# Patient Record
Sex: Female | Born: 1935 | Race: White | Hispanic: No | Smoking: Former smoker
Health system: Southern US, Community
[De-identification: ages and names within clinical notes are randomized; demographics above are authoritative.]

## PROBLEM LIST (undated history)

## (undated) DIAGNOSIS — H3530 Unspecified macular degeneration: Secondary | ICD-10-CM

## (undated) DIAGNOSIS — R0602 Shortness of breath: Secondary | ICD-10-CM

## (undated) DIAGNOSIS — E039 Hypothyroidism, unspecified: Secondary | ICD-10-CM

## (undated) DIAGNOSIS — I10 Essential (primary) hypertension: Secondary | ICD-10-CM

## (undated) DIAGNOSIS — H269 Unspecified cataract: Secondary | ICD-10-CM

## (undated) DIAGNOSIS — I639 Cerebral infarction, unspecified: Secondary | ICD-10-CM

## (undated) DIAGNOSIS — I499 Cardiac arrhythmia, unspecified: Secondary | ICD-10-CM

## (undated) DIAGNOSIS — M810 Age-related osteoporosis without current pathological fracture: Secondary | ICD-10-CM

## (undated) DIAGNOSIS — E785 Hyperlipidemia, unspecified: Secondary | ICD-10-CM

## (undated) DIAGNOSIS — E119 Type 2 diabetes mellitus without complications: Secondary | ICD-10-CM

## (undated) DIAGNOSIS — J449 Chronic obstructive pulmonary disease, unspecified: Secondary | ICD-10-CM

## (undated) HISTORY — PX: TONSILLECTOMY: SUR1361

---

## 1998-05-20 ENCOUNTER — Emergency Department (HOSPITAL_COMMUNITY): Admission: EM | Admit: 1998-05-20 | Discharge: 1998-05-20 | Payer: Self-pay | Admitting: Emergency Medicine

## 1999-06-11 ENCOUNTER — Emergency Department (HOSPITAL_COMMUNITY): Admission: EM | Admit: 1999-06-11 | Discharge: 1999-06-11 | Payer: Self-pay

## 2000-10-17 ENCOUNTER — Ambulatory Visit (HOSPITAL_COMMUNITY): Admission: RE | Admit: 2000-10-17 | Discharge: 2000-10-17 | Payer: Self-pay | Admitting: Ophthalmology

## 2000-11-15 ENCOUNTER — Other Ambulatory Visit: Admission: RE | Admit: 2000-11-15 | Discharge: 2000-11-15 | Payer: Self-pay | Admitting: Internal Medicine

## 2001-02-21 ENCOUNTER — Encounter: Payer: Self-pay | Admitting: Ophthalmology

## 2001-02-23 ENCOUNTER — Ambulatory Visit (HOSPITAL_COMMUNITY): Admission: RE | Admit: 2001-02-23 | Discharge: 2001-02-23 | Payer: Self-pay | Admitting: Ophthalmology

## 2001-05-11 ENCOUNTER — Encounter: Payer: Self-pay | Admitting: Otolaryngology

## 2001-05-11 ENCOUNTER — Encounter: Admission: RE | Admit: 2001-05-11 | Discharge: 2001-05-11 | Payer: Self-pay | Admitting: Otolaryngology

## 2003-07-13 ENCOUNTER — Emergency Department (HOSPITAL_COMMUNITY): Admission: EM | Admit: 2003-07-13 | Discharge: 2003-07-13 | Payer: Self-pay | Admitting: Emergency Medicine

## 2003-08-11 ENCOUNTER — Encounter: Admission: RE | Admit: 2003-08-11 | Discharge: 2003-08-11 | Payer: Self-pay | Admitting: Internal Medicine

## 2003-09-25 ENCOUNTER — Ambulatory Visit (HOSPITAL_COMMUNITY): Admission: RE | Admit: 2003-09-25 | Discharge: 2003-09-25 | Payer: Self-pay | Admitting: Orthopaedic Surgery

## 2004-07-26 ENCOUNTER — Encounter: Admission: RE | Admit: 2004-07-26 | Discharge: 2004-07-26 | Payer: Self-pay | Admitting: Internal Medicine

## 2005-05-06 ENCOUNTER — Ambulatory Visit (HOSPITAL_COMMUNITY): Admission: RE | Admit: 2005-05-06 | Discharge: 2005-05-06 | Payer: Self-pay | Admitting: Internal Medicine

## 2007-06-19 ENCOUNTER — Encounter: Admission: RE | Admit: 2007-06-19 | Discharge: 2007-06-19 | Payer: Self-pay | Admitting: Internal Medicine

## 2007-10-11 ENCOUNTER — Inpatient Hospital Stay (HOSPITAL_COMMUNITY): Admission: EM | Admit: 2007-10-11 | Discharge: 2007-10-15 | Payer: Self-pay | Admitting: Emergency Medicine

## 2007-10-11 ENCOUNTER — Ambulatory Visit: Payer: Self-pay | Admitting: Cardiology

## 2007-10-12 ENCOUNTER — Ambulatory Visit: Payer: Self-pay | Admitting: Surgery

## 2007-10-12 ENCOUNTER — Encounter (INDEPENDENT_AMBULATORY_CARE_PROVIDER_SITE_OTHER): Payer: Self-pay | Admitting: Internal Medicine

## 2008-04-30 ENCOUNTER — Encounter: Admission: RE | Admit: 2008-04-30 | Discharge: 2008-04-30 | Payer: Self-pay | Admitting: Internal Medicine

## 2010-11-23 ENCOUNTER — Ambulatory Visit (HOSPITAL_COMMUNITY)
Admission: RE | Admit: 2010-11-23 | Discharge: 2010-11-23 | Disposition: A | Discharge status: Home / Self Care | Payer: Medicare Other | Source: Ambulatory Visit | Attending: Endocrinology | Admitting: Endocrinology

## 2010-11-23 ENCOUNTER — Other Ambulatory Visit (HOSPITAL_COMMUNITY): Payer: Self-pay | Admitting: Endocrinology

## 2010-11-23 DIAGNOSIS — M79609 Pain in unspecified limb: Secondary | ICD-10-CM | POA: Insufficient documentation

## 2010-11-23 DIAGNOSIS — M169 Osteoarthritis of hip, unspecified: Secondary | ICD-10-CM | POA: Insufficient documentation

## 2010-11-23 DIAGNOSIS — R52 Pain, unspecified: Secondary | ICD-10-CM

## 2010-11-23 DIAGNOSIS — M1610 Unilateral primary osteoarthritis, unspecified hip: Secondary | ICD-10-CM | POA: Insufficient documentation

## 2010-12-02 ENCOUNTER — Ambulatory Visit (HOSPITAL_COMMUNITY)
Admission: RE | Admit: 2010-12-02 | Discharge: 2010-12-02 | Disposition: A | Discharge status: Home / Self Care | Payer: Medicare Other | Source: Ambulatory Visit | Attending: Orthopedic Surgery | Admitting: Orthopedic Surgery

## 2010-12-02 DIAGNOSIS — I70209 Unspecified atherosclerosis of native arteries of extremities, unspecified extremity: Secondary | ICD-10-CM | POA: Insufficient documentation

## 2010-12-02 DIAGNOSIS — M79609 Pain in unspecified limb: Secondary | ICD-10-CM

## 2010-12-02 DIAGNOSIS — M549 Dorsalgia, unspecified: Secondary | ICD-10-CM | POA: Insufficient documentation

## 2010-12-02 DIAGNOSIS — I10 Essential (primary) hypertension: Secondary | ICD-10-CM | POA: Insufficient documentation

## 2011-02-08 NOTE — H&P (Signed)
NAME:  Alison Singleton, Alison Singleton                 ACCOUNT NO.:  [id number]   MEDICAL RECORD NO.:  [id number]          PATIENT TYPE:  EMS   LOCATION:  MAJO                         FACILITY:  [facility]   PHYSICIAN:  Mark A. Perini, M.D.   DATE OF BIRTH:  1916-10-22   DATE OF ADMISSION:  10/11/2007  DATE OF DISCHARGE:                              HISTORY & PHYSICAL   CHIEF COMPLAINT:  Trouble speaking and weakness.   HISTORY OF PRESENT ILLNESS:  Alison Singleton is a pleasant 77 year old female who  was outside wrapping up her garden hose due to some expected cold  weather this evening.  She got down low towards the ground and had a  very difficult time getting back up, which is unusual for her.  Then she  noticed about the same time at about 2:30 this afternoon that she had  trouble seeing in her right field of vision, and she was having some  trouble with word-finding.  She eventually called her daughter to say  that she was having some trouble.  Her daughter came to the house around  5:00 or 5:30 and called our office, and she was brought to the emergency  room by ambulance.  She will require admission for possible left brain  stroke.   PAST MEDICAL HISTORY:  1. Type 2 diabetes.  2. Hypertension.  3. History of paroxysmal supraventricular tachycardia.  4. Hyperlipidemia.  5. Osteoporosis.  6. Chronic obstructive pulmonary disease.  7. History of atrial fibrillation.  8. Allergic rhinitis.  9. Bilateral cataracts.  10.Left ventricular hypertrophy.  11.Cervical spur.  12.Past history of shingles.   ALLERGIES:  No known allergies.   MEDICATIONS:  Synthroid 0.1 mg daily.  She says she does not take  Fosamax or Allegra even though these are on her medication sheet from  the office.  She is on Micardis of an unknown dose.  She says she  stopped TriCor and stopped vitamin D.   SOCIAL HISTORY:  She is widowed.  She has a son who has had brain damage  and is still in a nursing facility since he was injured at  age 70.  Daughter named Victorino Dike, who has health care power-of-attorney.  No  tobacco, no alcohol use.   FAMILY HISTORY:  Father died of an intracranial hemorrhage.  Mother died  of cancer.   REVIEW OF SYSTEMS:  No chest pain.  No shortness of breath.  Normal  bowel movements.  Normal urine output and function.  She states she has  a normal appetite.  She denies any focal weakness or swallowing problems  or diplopia.   PHYSICAL EXAMINATION:  VITAL SIGNS:  Temperature 97.8, blood pressure  178/70, pulse 52, respiratory rate 18, and 98% saturation on room air.  NEUROLOGIC:  She is in no acute distress.  She is alert.  She has  dysphasia and has a lot of trouble with word finding, but she can  converse fairly fluently and appropriately.  Cranial nerves II-XII are  normal.  There is no diplopia.  She states that she does still have some  haziness in the  vision out of the right field of vision.  She has  trouble with right upper extremity finger-nose-finger testing and is a  bit apraxic with her right hand.  Otherwise, there is no focal weakness  noted.  LUNGS:  Clear to auscultation bilaterally with no wheezes, rales or  rhonchi.  HEART:  Regular in rate and rhythm with a 1 to 2 over 6 murmur heard at  the left sternal border in systole.  ABDOMEN:  Soft, nontender, nondistended.  There is no mass or  hepatosplenomegaly.  EXTREMITIES:  There is no peripheral edema.  She moves extremities x 4.   LABORATORY DATA:  White count 9.4 with a normal differential.  Hemoglobin 12.7, platelet count 204,000.  INR 1.0.  Cranial CT scan  personally reviewed shows no acute pathology but there are some old  right basal ganglia lacunes noted.  Sodium 140, potassium 4.7, chloride  111, CO2 24, BUN 18, creatinine 0.9, glucose 108.   ASSESSMENT/PLAN:  An 77 year old female with a probable left brain  infarct.  We will admit to a telemetry bed.  We will check an EKG.  We  will add aspirin  325 mg daily.   We will check an MRI/MRA of the brain  as well as carotid Dopplers and a 2-D echo, and we will also obtain a  neurology consultation.      Mark A. Perini, M.D.  Electronically Signed     MAP/MEDQ  D:  10/11/2007  T:  10/11/2007  Job:  [id number]   cc:   Gwen Pounds, MD

## 2011-02-08 NOTE — Consult Note (Signed)
NAME:  Alison Singleton, Alison Singleton                 ACCOUNT NO.:  [id number]   MEDICAL RECORD NO.:  [id number]          PATIENT TYPE:  INP   LOCATION:  4730                         FACILITY:  [facility]   PHYSICIAN:  Bevelyn Buckles. Champey, M.D.DATE OF BIRTH:  Nov 10, 1916   DATE OF CONSULTATION:  10/12/2007  DATE OF DISCHARGE:                                 CONSULTATION   REQUESTING PHYSICIAN:  Dr. Timothy Lasso.   REASON FOR CONSULT:  Stroke.   HISTORY OF PRESENT ILLNESS:  Alison Singleton is an 77 year old Caucasian  female with multiple medical problems who presented to the ED after  decreased vision on the right, slurred speech, word-finding difficulty,  and right-sided weakness.  The patient's symptoms gradually improved and  resolved.  She still has some dysarthria which the patient states is  normal.  She denies any current vision changes, speech changes,  numbness, tingling, dizziness, vertigo, headaches, falls or loss of  consciousness.  The chart is reviewed.   PAST MEDICAL HISTORY:  Is positive for.  1. Diabetes.  2. Hypertension.  3. PSVT.  4. Hyperlipidemia.  5. Atrial fibrillation in past.  6. Osteoporosis.  7. COPD.   CURRENT MEDICATIONS:  1. Synthroid.  2. Avapro.  3. Colace.  4. Aspirin (started on hospitalization).   ALLERGIES:  None.   SOCIAL HISTORY:  The patient lives alone.  Denies any smoking, alcohol,  or drug use.   FAMILY HISTORY:  Positive for ICH and chain cancer.   REVIEW OF SYSTEMS:  Positive as per HPI.  Also for confusion.  Review of  systems negative as per history of present illness in greater than seven  other organ systems.   EXAMINATION:  VITALS:  Temperature is 98.6, blood pressure 158/62, pulse  60, respirations 22, O2 saturation 99%, CBG 99.  HEENT:  Normocephalic, atraumatic.  Visual fields are intact to finger  counting.  NECK:  Supple.  No carotid bruits.  HEART:  Is regular.  LUNGS:  Clear.  ABDOMEN:  Soft.  EXTREMITIES:  Show good pulses.  NEUROLOGIC  EXAMINATION:  The patient is awake, drowsy.  She is oriented  to name, place, not to year.  She has slightly dysarthric speech at  times.  Mini-mental status is 18/29.  The patient is oriented 5/10, gets  0/3 recall, gets 3/5 spelling world backwards, follows two- to three-  step commands.  Cranial nerves:  Extraocular muscles are intact.  Face  symmetric.  Visual fields are intact.  Motor examination.  The patient  has 4+/5 strength throughout, normal tone.  The patient does have slight  right extremity drift noted.  Sensory examination is within normal  limits to light touch.  Reflexes 1+ throughout.  Cerebellar function is  within normal limits finger-to-nose.  Gait was not assessed secondary to  safety.  MRI/MRA of the brain which was reviewed showed no acute infarct  with motion-degraded MRA with moderate atherosclerosis.   LABORATORY DATA:  Cholesterol 179, LDL 120. WBCs 9.1, hemoglobin 11.7,  hematocrit 34.3, platelets 176.  Sodium is 142, potassium 3.5, chloride  110, CO2 25, BUN 12, creatinine 0.81, glucose  87, calcium 8.8, AST 17,  ALT 12.   IMPRESSION:  This is an 77 year old with decreased right visual field  cut, right-sided weakness and dysarthria, most of which has resolved.  Patient still has slight dysarthria and some slight right upper  extremity drift.  I have reviewed the MRI scan which showed no acute  stroke.  Differential diagnosis of transient ischemic attack versus a  small infarct.  MRI might miss small ischemic infarcts.  I agree with  starting the patient on an aspirin per day.  The patient does have an  elevated LDL level.  Will start on Lipitor 10 mg per day.  I agree with  checking 2-D echo and carotid Dopplers and will check a fasting  homocystine level and get PT, OT and speech consults.  The patient also  might have an underlying dementia versus delirium.  We will check TSH,  B12, RPR, ESR, and folate.  Will follow the patient while she is in the   hospital on the stroke consult service.      Bevelyn Buckles. Nash Shearer, M.D.  Electronically Signed     DRC/MEDQ  D:  10/12/2007  T:  10/12/2007  Job:  [id number]

## 2011-02-08 NOTE — Discharge Summary (Signed)
NAME:  Alison Singleton, Alison Singleton NO.:  [id number]   MEDICAL RECORD NO.:  [id number]          PATIENT TYPE:  INP   LOCATION:  4730                         FACILITY:  [facility]   PHYSICIAN:  Gwen Pounds, MD       DATE OF BIRTH:  1917-01-19   DATE OF ADMISSION:  10/11/2007  DATE OF DISCHARGE:  10/15/2007                               DISCHARGE SUMMARY   PRIMARY CARE PHYSICIAN:  Creola Corn, M.D.   NEUROLOGIST:  Deneen Harts, M.D.   DISCHARGE DIAGNOSES:  1. 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  2. A 40-60% left common carotid artery stenosis without internal      carotid artery stenosis.  3. Resolved stroke.  4. Hypertension.  5. Hyperlipidemia.  6. Diet-controlled diabetes with A1c 5.9.  7. Recurrent SVT with history of paroxysmal atrial fibrillation,      currently off Coumadin secondary to history of bleeding.  8. Right-sided weakness and dysarthria, improved.  9. Osteoporosis.  10.History of chronic obstructive pulmonary disease.  11.History of allergic rhinitis.  12.History of bilateral cataracts.  13.Left ventricular hypertrophy.  14.Cervical spurring.  15.Past history of shingles.   DISCHARGE MEDICATIONS:  1. Fosamax 70 weekly.  2. Simvastatin 40 mg daily.  3. Micardis 40 mg p.o. daily.  4. Digitek 0.125 mg 1/2 p.o. q.a.m.  5. Synthroid 0.1 mg p.o. q.a.m.  6. Plavix 75 mg p.o. daily.  7. Allegra 180 mg p.o. daily for allergies or congestion.  8. Aspirin 81 daily.  9. Tylenol as needed for aches and pains.  10.Vitamin D 1000 units over-the-counter daily.  11.Norvasc 5 mg p.o. q.a.m.   PROCEDURES:  Include:  1. Medical management.  2. October 11, 2007 a cranial CT which showed no acute intracranial      findings and old right basal ganglia lacunes.  3. MRI/MRA done on January 15 showed no acute infarct, slight motion      degradation. Examination reveals moderate intracranial      atherosclerotic-type changes.  4. Chest x-ray  January 15 showed peribronchial thickening without      focal air-space disease, cardiomegaly, and mild basilar atelectasis      with scarring.  5. Repeat head CT scan on January 16 due to new symptoms showed      chronic changes without apparent acute intracranial abnormalities.  6. Two-D echocardiogram done on January 16 revealed EF 55-65%. No left      ventricular wall motion abnormality. Left ventricular wall      thickness was mildly increased. There was mild focal basal septal      hypertrophy. Doppler parameters were consistent with high left      ventricular filling pressures. There was trivial aortic valve      regurgitation. There was moderate mitral annular calcification.      There was mild mitral valve regurgitation. Left atrium was      moderately to markedly dilated.  7. Carotid Doppler revealed right moderate calcific plaque noted in      the common carotid. Left mixed plaque noted throughout. Right ECA  stenosis. Vertebral artery was antegrade. No significant right ICA      stenosis and 40-60% left ICA stenosis noted.  8. Repeat MRI on October 13, 2007 revealed left medial temporal lobe      hippocampus and left occipital lobe abnormalities consistent with      acute infarct as noted above. There may also be a small left      thalamic infarct as well. Please see report for full details. The      patient's clinical course was consistent with acute stroke, not      encephalitis.   HISTORY OF PRESENT ILLNESS:  Briefly, Alison Singleton is an 77 year old  pleasant female who lives alone and is very functional who went outside  to work on her hose in her garden prior to really cold weather coming  in. She bent down and had a hard and difficult time getting up. She  noticed this somewhere around 2:30 in the afternoon. She also had right  visual field issues and trouble with word finding. She muddled around at  home, got her daughter to come over. The daughter called our  office  around 5:00-5:30 for advice. We told them to get in the ambulance and go  to the ER for further evaluation and treatment. She was seen and  evaluated in the emergency department. She was in no acute distress. She  had some word finding issues as well as dysphagia and a film that she  reported over her vision. She had trouble with right upper extremity  finger-to-nose testing and was a little bit apraxic with the right hand.  Otherwise, no other focal abnormalities were noted. Her laboratory data  was unremarkable. Cranial CT at the time showed just old right basal  ganglia lacunes, otherwise unremarkable, and it was elected to admit her  for further evaluation and treatment to a telemetry bed and get the  usual workup.   HOSPITAL COURSE:  Alison Singleton was admitted through the emergency  department on October 11, 2007 with acute stroke-like illness. She was  too far out for a code stroke or for direct lytics, and she was admitted  for further evaluation and treatment. Neurology consultation was  ordered. Laboratory data ordered and the above testing was done in the  order listed. The following morning, she was doing better. She still had  a film over her eyes but had no difficulty with speech and was  approaching her baseline. We elected to allow the workup to continue.  Dr. Nash Shearer from neurology came and saw her. Later that morning, after  returning from procedures, Alison Singleton was difficult to arouse, and she  had a mental status change as well as neurologic change. A code stroke  was called. Fluid bolus was given because it was noted that her blood  pressure was on the low side. As the fluid bolus worked and her blood  pressure went up, her mental state returned to normal. At that point, it  was determined that she had too low of a blood pressure. She worked with  speech therapy, occupational therapy, physical therapy. At first, she  was on a dysphagia 3 diet with thickened  liquids but does not need this  at this current time. She will get home health physical therapy and  occupational therapy on discharge. On October 13, 2007, she had a 21-  beat run of SVT. Then, her heart rate went back into the 40s and 50s  where it  typically is. Throughout her hospital course, I had a  conversation with Dr. Elease Hashimoto, and we felt that just keeping her on a  little bit of an AV nodal blocking agent is all that she needs, as long  as she can tolerate it; her heart rate being in the upper 40s to low  50s, digoxin seems to be the most appropriate drug. For her diabetes,  she had CBGs checked throughout the hospital stay. Most of her blood  sugars were between 100 and 140. There was one that went up to 200. She  had one dose of insulin sliding scale as far as I can tell.   The lab reports showed her LDL as 180. We pushed her back up on statins.  She seems to be tolerating them in the hospital.   On October 15, 2007, Alison Singleton's symptoms from her acute stroke  seemed 98% resolved if not more. Neurology had signed off, and it was  determined that she could be discharged safely home on Plavix with 81 mg  of aspirin added to the list as well as good and tight blood pressure  control as well as risk factor reduction. She was instructed to live  with her daughter for the next couple of weeks and no driving until  given permission from either myself or Dr. Nash Shearer. The reason for the  repeat CT scan was the new neurologic event on the 16th. The reason for  the repeat MRI is that the first one was not adequate to see the stroke,  but the second one certainly did see the multiple small-vessel disease  stroke that it appears that she has had, and it certainly appears to be  more hypertensive in nature rather than embolic. She will be treated for  everything from here, and she will be going home in stable condition at  this current time. Lab work on October 15, 2007 revealed a  normal BMET,  a normal CBC. The day before, she had a normal nonreactive RPR. TSH was  fine. Homocysteine level was fine. A1c was 5.9. B12 level was 727. Her  only lab abnormality throughout the hospital stay was the LDL was 180. I  called the daughter today, and I will give her the update on the plan,  and the daughter wanted me to look into her leg pain and using of the  walker. I told her I can certainly do this as an outpatient.      Gwen Pounds, MD  Electronically Signed     JMR/MEDQ  D:  10/15/2007  T:  10/15/2007  Job:  [id number]   cc:   Estanislado Pandy, MD  Gwen Pounds, MD  Vesta Mixer, M.D.

## 2011-02-11 NOTE — H&P (Signed)
[place]  Patient:    Alison, Singleton                        MRN: [id number] Adm. Date:  [id number] Attending:  Ivor Messier CC:         Jonelle Sports. Cheryll Cockayne, M.D.   History and Physical  REASON FOR ADMISSION:  This was a planned outpatient surgical readmission of this 77 year old white female admitted for cataract/implant surgery of the left eye.  PRESENT ILLNESS:  This patient has noted deterioration in vision in both eyes due to progressive cataract formation.  She was previously admitted on October 17, 2000 for cataract/implant surgery of the right eye.  Patient did well following this surgery and is now ready for similar surgery of the left eye as vision has deteriorated to 20/60 in the involved eye, compared to 20/25 with the operated eye.  She has signed an informed consent and arrangements made for her outpatient admission at this time.  PAST MEDICAL HISTORY:  See old chart.  Patient continues under the care of Dr. Jonelle Sports. [place].  She is said to be in stable general health, taking multiple medications including Lanoxin, Synthroid, Glucovance, Fosamax, and is said to have no known allergies.  REVIEW OF SYSTEMS:  No cardiorespiratory complaints.  PHYSICAL EXAMINATION:  VITAL SIGNS:  As recorded on admission -- blood pressure 158/74, temperature 97, pulse 55, respirations 18.  GENERAL APPEARANCE:  Patient is a pleasant, well-nourished, well-developed, white 77 year old female in no acute distress.  HEENT:  Eyes:  Visual acuity as noted above.  Applanation tonometry normal. Slitlamp examination:  The eyes are white and clear with clear corneae, deep and clear anterior chambers.  A well-centered clear posterior chamber implant is present in the right eye and cortical and nuclear cataract change in the left eye.  Fundus examination reveals clear vitreous, attached retinae with normal optic nerves, blood vessels and  maculae.  CHEST:  Lungs clear to percussion and auscultation.  HEART:  Normal sinus rhythm.  No cardiomegaly.  No murmurs.  ABDOMEN:  Negative.  EXTREMITIES:  Negative.  ADMISSION DIAGNOSES: 1. Senile cataract, left eye. 2. Pseudophakia, right eye.  SURGICAL PLAN:  Cataract implant surgery, left eye, under local anesthesia. DD:  02/23/01 TD:  02/23/01 Job: [id number]

## 2011-02-11 NOTE — Discharge Summary (Signed)
NAME:  Alison Singleton, Alison Singleton NO.:  [id number]   MEDICAL RECORD NO.:  [id number]          PATIENT TYPE:  INP   LOCATION:  4730                         FACILITY:  [facility]   PHYSICIAN:  Gwen Pounds, MD       DATE OF BIRTH:  [birth year]   DATE OF ADMISSION:  10/11/2007  DATE OF DISCHARGE:  10/15/2007                               DISCHARGE SUMMARY   PRIMARY CARE PHYSICIAN:  Gwen Pounds, MD   NEUROLOGIC:  Bevelyn Buckles. Champey, MD   DISCHARGE DIAGNOSES:  1. Acute cerebrovascular accident at the hippocampus and basal ganglia      area.  2. Mild dysarthria.  3. Mild coordination difficulties.  4. Type 2 diabetes.  5. Hypertension.  6. Hyperlipidemia.  7. Deconditioning.  8. Supraventricular tachycardia with past history of atrial      fibrillation, none during this hospital stay.  9. History of retinal hemorrhage on aspirin for which he maintains on      Plavix.  10.Osteoporosis.  11.Allergic rhinitis.  12.Hypothyroidism.  13.History of bilateral cataracts.  14.Known cervical spur.  15.History of shingles.  16.Mild chronic obstructive pulmonary disease.  17.Known left ventricular hypertrophy.  18.Forty to 60% left internal carotid artery stenosis.   DISCHARGE PROCEDURES:  Include:   1. MRI of the brain, done on 10/13/2007 revealing left medial temporal      lobe/hippocampus and left occipital lobe abnormality consistent      with an acute infarct as noted in the body of the report.  There      may also be a small left thalamic infarct.  2. Cranial CT on 10/12/2007 reveals chronic changes without apparent      acute intracranial findings.  3. Portable chest x-ray done 10/11/2007 showed mild peribronchial      thickening without focal airspace disease.  Cardiomegaly with mild      right basilar atelectasis/scarring.  4. MRI of the brain dated 10/11/2007 showed slightly motion degraded      exam, reveals moderate intracranial atherosclerotic type changes  discussed in the body and there is no acute infarct seen on the      first MRI.  5. Cranial head CT, 10/11/2007 showed no acute intracranial findings,      old right basal ganglia lacunes.  6. EKG done on 10/11/2007 shows normal sinus rhythm with sinus      arrhythmia, otherwise normal EKG.  7. Carotid duplex reveals right moderate calcific plaque noted in the      common carotid artery.  Left mixed plaque noted throughout.  Right      external carotid artery stenosis.  Vertebral artery flow antegrade      bilaterally.  No significant right ICA stenosis noted, and a  40-      60% left ICA stenosis is noted.  8. Two-dimensional echocardiogram done by Dr. Elease Hashimoto reveals EF 55% to      60%.  LVH with increased thickness.  Mild focal basal septal      hypertrophy, aortic valve regurgitation, left atrial moderate      dilatation.  DISCHARGE MEDICATIONS:  1. Fosamax 70 weekly.  2. Simvastatin 40 mg daily.  3. Micardis 40 mg p.o. daily.  4. Digitek 0.125 mg 1/2 p.o. q.a.m.  5. Synthroid 0.1 mg p.o. q.a.m.  6. Plavix 75 mg p.o. daily.  7. Allegra 180 mg p.o. daily for allergies, more congestion.  8. Aspirin 81 mg p.o. daily.  9. Tylenol as needed for aches and pains.  10.Vitamin D 1000 units over-the-counter daily.  11.Norvasc 5 mg p.o. q.a.m.  12.Calcium 600 mg 1-2 tablets per day for bones.  This is over-the-      counter.  13.She is to stop taking Coumadin, Tricor.  14.She is to decrease her dig dosing from prior to coming into the      hospital and change the Vytorin to generic Zocor, and not to take      any Propranolol.   BRIEF HISTORY AND PHYSICAL:  Includes Alison Singleton is a pleasant 77-year-  old female who on 10/11/2007 was outside wrapping up her garden hose due  to expected cold weather.  She got down low towards the ground and had a  very difficult time getting back up, which is unusual for her.  This  stuttered and around 2:30 this afternoon she had trouble seeing in her   right field of vision and having trouble seeing in her right field of  vision and having trouble with some word finding.  Later that afternoon  she called her daughter and told her that she is having trouble.  Her  daughter came to the house around 5 or 5:30, called our office and was  brought emergently to the emergency room by EMS.  Cranial CT done in the  emergency room was negative.  Labs were negative.  Physical exam was  consistent with an acute stroke.  Cranial CT was negative.  Since the  start of her symptoms she is outside the window for tPA.  Her symptoms  were improving in the emergency department.   HOSPITAL COURSE:  This is an 77 year old female admitted by Dr. Waynard Edwards  from the emergency department on 10/11/2007 with probable acute left  brain CVA.  She was admitted to a telemetry bed.  EKG was obtained.  Aspirin was added and appropriate workup was initiated.  The following  day I saw her on rounds.  She had some altered right field vision, some  word-finding issues, but all of her motor issues had resolved from the  day before.  She felt washed out, but doing better.  Labs were reviewed  and were negative.  This was presumed brain CVA.  That morning labs came  back with an LDL of 120, blood pressure was 150/74.  MRI and MRA,  carotids, 2D echo were all ordered and I went ahead and consulted  neurology for help with her inpatient workup.  We continued the Micardis  and added some Norvasc that morning.  Neurology saw her and thought her  right visual field issues had resolved.  Her weakness was resolving and  dysarthria was improving.  The MRI had been obtained by the time I saw  her that morning, and it was relatively unremarkable, and agree with the  further workup.  The echocardiogram was done later that day and was  unremarkable, which I have expected actually.  Later that morning, Code  Stroke was called to her room due to acute neurologic changes.  Myself  and Dr.  Nash Shearer responded.  Dr. Nash Shearer was there while it  was  happening.  I was calling in orders and getting over there a little bit  later, and the acute neurologic changes were felt not to make her a  candidate for IV tPA.  Stat cranial CT was obtained and did not show any  new acute abnormalities.  It was felt maybe to be due to too low a blood  pressure.  Blood pressure did drop down into the 120s and fluid bolus  seemed to help her dramatically.  She resumed her baseline mentation and  physical within an hour of the event.   The rest of the hospital stay was spent obtaining physical therapy,  occupational therapy, speech therapy, and some finishing up her workup.  She had another MRI which eventually did show the stroke.  The 2D  echocardiogram, the carotids were all as listed above.  Over the next  couple of days she improved nicely.  There was no secondary sources of  the stroke and it was just determined that she just needed aggressive  risk factor modification.   On 10/15/2007, Ms. Lundblad was ready for discharge on the current list of  medications and risk factor reduction and blood pressure control.  Her  blood pressure had titrated down into the 120s/60s over the last few  days and she is tolerating this well and will continue it here.  Her  cholesterol was determined to need statins and she is tolerating the  Simvastatin very well and will continue that.  She has got diet-  controlled diabetes.  Her A1c is fine.  Her blood sugars at the hospital  stay were all between 98 and 120s.  She had a run of SVT in the hospital  and we continued her on her digoxin and current home medications.  She  did not need any treatment for this and we went over the history of her  remote history of AFib and the Coumadin is on hold secondary to history  of an eye bleed, and the goal was just to keep her heart rate under  control.  We determined that we are going to discharge the patient home  with  the daughter living with her for the first week or 2 weeks, do home  health PT and OT, no driving, and have her follow up with me as an  outpatient.      Gwen Pounds, MD  Electronically Signed     JMR/MEDQ  D:  11/01/2007  T:  11/02/2007  Job:  [id number]

## 2011-02-11 NOTE — Op Note (Signed)
[place]  Patient:    JANIE, CAPP                        MRN: [id number] Proc. Date: 02/23/01 Adm. Date:  [id number] Attending:  Ivor Messier CC:         Jonelle Sports. Cheryll Cockayne, M.D.   Operative Report  PREOPERATIVE DIAGNOSIS:  Senile cataract, left eye.  POSTOPERATIVE DIAGNOSIS:  Senile cataract, left eye.  OPERATION:  Planned extracapsular cataract extraction -- phacoemulsification, primary insertion of posterior chamber intraocular lens implant.  SURGEON:  Guadelupe Sabin, M.D.  ASSISTANT:  Nurse.  ANESTHESIA:  Local 4% Xylocaine, 0.75% Marcaine retrobulbar block, topical tetracaine, intraocular Xylocaine.  Anesthesia standby required in this elderly patient.  Patient given Ditropan intravenously during the period of retrobulbar injection.  OPERATIVE PROCEDURE:  After the patient was prepped and draped, a lid speculum was inserted in the left eye.  Schiotz tonometry was recorded at 8 scale units with a 5.5 g weight.  A peritomy was performed adjacent to the limbus from the 11 to 1 oclock position.  The corneoscleral junction was cleaned and a corneoscleral groove made with a 45 degree Superblade.  The anterior chamber was then entered with the diamond 2.5-mm keratome at the 12 oclock position and the 15 degree blade at the 2:30 position.  Using a bent 26-gauge needle on a Healon syringe, a circular capsulorrhexis was begun and then completed with the Grabow forceps.  Hydrodissection and hydrodelineation were performed using 1% Xylocaine.  The 30 degree phacoemulsification tip was then inserted for slow controlled emulsification of the lens nucleus, total ultrasonic time -- 42 seconds, average power level -- 16%, total amount of fluid used -- 50 cc. Following removal of the nucleus, the residual cortex was aspirated with the irrigation-aspiration tip.  The posterior capsule appeared intact with a brilliant red fundus reflex; it  was therefore elected to insert an [employer] Opt[employer] [id number] silicone posterior chamber intraocular lens implant with UV absorber, diopter strength +20.50.  This was inserted with a McDonald forceps into the anterior chamber and then centered into the capsular bag using the [facility] lens rotator.  The lens appeared to be well-centered.  The Healon which had been used during the procedure was then aspirated and replaced with balanced salt solution and Miochol ophthalmic solution.  The operative incisions appeared to be self-sealing and no sutures were required. Maxitrol ointment was instilled in the conjunctival cul-de-sac and a light patch and protective shield applied.  Duration of procedure and anesthesia administration -- 45 minutes.  Patient tolerated the procedure well in general and left the operating room for the recovery room in good condition. DD:  02/23/01 TD:  02/23/01 Job: [id number]

## 2011-02-11 NOTE — Op Note (Signed)
[place]  Patient:    Alison Singleton, Alison Singleton                        MRN: [id number] Proc. Date: 10/17/00 Adm. Date:  [id number] Disc. Date: [id number] Attending:  Ivor Messier CC:         Jonelle Sports. Cheryll Cockayne, M.D.                           Operative Report  PREOPERATIVE DIAGNOSIS:  Senile nuclear cataract, right eye.  POSTOPERATIVE DIAGNOSIS:  Senile nuclear cataract, right eye.  OPERATION:  Planned extracapsular cataract extraction -- phacoemulsification, primary insertion of posterior chamber intraocular lens implant.  SURGEON:  Guadelupe Sabin, M.D.  ASSISTANT:  Nurse.  ANESTHESIA:  Local 4% Xylocaine, 0.75% Marcaine retrobulbar block, topical tetracaine, intraocular Xylocaine.  Anesthesia standby required in this 77 year old patient.  Patient given sodium pentothal or Ditropan intravenously during the period of retrobulbar block.  DESCRIPTION OF PROCEDURE:  After the patient was prepped and draped, a lid speculum was inserted in the right eye.  Schiotz tonometry was performed revealing a normotensive eye.  A peritomy was performed adjacent to the limbus from the 11 to 1 oclock position.  The corneoscleral junction was cleaned and a corneoscleral groove made with a 45 degree Superblade.  The anterior chamber was then entered with the 2.5-mm diamond keratome at the 12 oclock position and a 15 degree blade at the 2:30 position.  Using a bent 26-gauge needle on a Healon syringe, a circular capsulorrhexis was begun and then completed with the Grabow forceps.  Hydrodissection and hydrodelineation were performed using 1% Xylocaine.  The 30 degree phacoemulsification tip was then inserted with slow controlled emulsification of the lens nucleus; total ultrasonic time -- 45 seconds; average power level -- 9%; total amount of fluid used -- 45 cc. Following removal of the nucleus, the residual cortex was aspirated with the irrigation-aspiration tip.   The posterior capsule appeared intact with a brilliant red fundus reflex; it was therefore elected to insert an [employer] SI40NB silicone three-piece posterior chamber intraocular lens implant with UV absorber, diopter strength +20.00.  This was inserted with the McDonald forceps into the anterior chamber and then centered into the capsular bag using the [facility] lens rotator; the lens appeared to be well-centered.  The Healon which had been used during the procedure was then aspirated and replaced with balanced salt solution and Miochol ophthalmic solution.  The operative incisions appeared to be self-sealing and no sutures were required. Maxitrol ointment was instilled in the conjunctival cul-de-sac and a light patch and protector shield applied.  Duration of procedure and anesthesia administration:  Forty-five minutes.  Patient tolerated the procedure well in general and left the operating room for the recovery room in good condition. DD:  10/24/00 TD:  10/24/00 Job: [id number]

## 2011-02-11 NOTE — H&P (Signed)
[place]  Patient:    Alison Singleton, Alison Singleton                        MRN: [id number] Adm. Date:  [id number] Disc. Date: [id number] Attending:  Ivor Messier CC:         Jonelle Sports. Cheryll Cockayne, M.D.                         History and Physical  REASON FOR ADMISSION:  This was a planned outpatient admission of this 77 year old white female admitted for cataract/implant surgery of the right eye.  PRESENT ILLNESS:  This patient has been followed in my office since August 03, 2000.  At that time, the patient stated she was having blurred vision, could not see fine print and could not do crossword puzzles; this has been noted in both eyes over the past six months and getting progressively worse.  Examination revealed a visual acuity of 20/100 -- right eye, 20/60, -- left eye, without correction, and 20/50 -- right eye, and 20/40 -- left eye, with best correction and refraction.  Slitlamp examination revealed nuclear cataract formation in both eyes, greater in the right than left eye.  Patient was given oral discussion and printed information concerning possible cataract surgery; she elected to proceed with the procedure and signed an informed consent.  Arrangements were made for her outpatient admission at this time.  PAST MEDICAL HISTORY:  Patient is under the care of Dr. Jonelle Sports. [place]. The patient states she has cardiac arrhythmias and is under stress.  She has thyroid dysfunction.  SOCIAL HISTORY:  Patient is a widow, lives alone, nonsmoker, social alcohol intake and definitely under stress.  CURRENT MEDICATIONS:  Lanoxin, Synthroid, aspirin, vitamins, folic acid.  PHYSICAL EXAMINATION  VITAL SIGNS:  As recorded on admission:  Blood pressure 175/93, temperature 97.2, pulse 57, respirations 16.  GENERAL APPEARANCE:  Patient is a pleasant 77 year old white female in no acute distress with limited vision.  HEENT:  Eyes:  Visual acuity as noted  above.  Slitlamp examination:  Nuclear cataract formations, both eyes.  Fundus examination normal.  Clear vitreous; attached retinae; normal optic nerves, blood vessels and maculae.  CHEST:  Lungs clear to percussion and auscultation.  HEART:  Normal sinus rhythm.  No cardiomegaly.  No murmurs.  ABDOMEN:  Negative.  EXTREMITIES:  Negative.  ADMISSION DIAGNOSIS:  Senile cataracts, right and left eyes.  SURGICAL PLAN:  Cataract/implant surgery, right eye now, left eye later. DD:  10/24/00 TD:  10/24/00 Job: [id number]

## 2011-06-16 LAB — DIFFERENTIAL
Basophils Absolute: 0
Basophils Absolute: 0
Basophils Absolute: 0.1
Basophils Absolute: 0.1
Basophils Absolute: 0.1
Basophils Relative: 0
Basophils Relative: 1
Basophils Relative: 1
Basophils Relative: 1
Eosinophils Absolute: 0.3
Eosinophils Absolute: 0.3
Eosinophils Absolute: 0.3
Eosinophils Relative: 3
Eosinophils Relative: 4
Lymphocytes Relative: 40
Lymphocytes Relative: 42
Lymphocytes Relative: 43
Lymphs Abs: 3.7
Lymphs Abs: 3.8
Lymphs Abs: 3.9
Monocytes Absolute: 0.6
Monocytes Absolute: 0.8
Monocytes Absolute: 0.8
Monocytes Relative: 8
Monocytes Relative: 9
Neutro Abs: 4
Neutro Abs: 4.3
Neutro Abs: 4.4
Neutrophils Relative %: 46
Neutrophils Relative %: 47
Neutrophils Relative %: 49

## 2011-06-16 LAB — COMPREHENSIVE METABOLIC PANEL
ALT: 12
ALT: 12
AST: 17
Albumin: 3.3 — ABNORMAL LOW
Alkaline Phosphatase: 36 — ABNORMAL LOW
Alkaline Phosphatase: 37 — ABNORMAL LOW
Alkaline Phosphatase: 39
BUN: 11
BUN: 15
BUN: 16
CO2: 25
CO2: 25
CO2: 26
Calcium: 8.7
Calcium: 8.8
Chloride: 105
Chloride: 107
Creatinine, Ser: 0.76
Creatinine, Ser: 0.78
GFR calc Af Amer: >60
GFR calc non Af Amer: >60
GFR calc non Af Amer: >60
GFR calc non Af Amer: >60
Glucose, Bld: 87
Glucose, Bld: 89
Glucose, Bld: 90
Potassium: 3.7
Potassium: 4.2
Sodium: 139
Sodium: 142
Total Bilirubin: 0.9
Total Bilirubin: 0.9
Total Bilirubin: 1.3 — ABNORMAL HIGH
Total Protein: 6.1

## 2011-06-16 LAB — PROTIME-INR
INR: 1
Prothrombin Time: 13.1

## 2011-06-16 LAB — TSH: TSH: 1.103

## 2011-06-16 LAB — I-STAT 8, (EC8 V) (CONVERTED LAB)
Acid-base deficit: 1
BUN: 18
Bicarbonate: 23.8
Chloride: 111
Glucose, Bld: 108 — ABNORMAL HIGH
HCT: 39
Hemoglobin: 13.3
Operator id: 261381
Potassium: 4.7
Sodium: 140
TCO2: 25
pCO2, Ven: 39.7 — ABNORMAL LOW
pH, Ven: 7.385 — ABNORMAL HIGH

## 2011-06-16 LAB — FOLATE RBC: RBC Folate: 454

## 2011-06-16 LAB — CBC
HCT: 34 — ABNORMAL LOW
HCT: 34.3 — ABNORMAL LOW
HCT: 35.6 — ABNORMAL LOW
HCT: 37.8
HCT: 39.6
Hemoglobin: 11.7 — ABNORMAL LOW
Hemoglobin: 12
Hemoglobin: 12.7
Hemoglobin: 13.3
MCHC: 33.5
MCHC: 33.9
MCHC: 34.2
MCV: 93.3
MCV: 93.7
MCV: 94.1
MCV: 94.3
Platelets: 166
Platelets: 176
Platelets: 179
Platelets: 180
Platelets: 204
RBC: 3.6 — ABNORMAL LOW
RBC: 3.67 — ABNORMAL LOW
RBC: 3.8 — ABNORMAL LOW
RBC: 4.02
RDW: 13.5
RDW: 13.5
RDW: 13.7
WBC: 8.7
WBC: 8.7
WBC: 9.1
WBC: 9.4

## 2011-06-16 LAB — URINALYSIS, ROUTINE W REFLEX MICROSCOPIC
Protein, ur: NEGATIVE
Urobilinogen, UA: 0.2

## 2011-06-16 LAB — POCT I-STAT CREATININE
Creatinine, Ser: 0.9
Operator id: 261381

## 2011-06-16 LAB — LIPID PANEL
Cholesterol: 179
HDL: 41
LDL Cholesterol: 120 — ABNORMAL HIGH
Total CHOL/HDL Ratio: 4.4
Triglycerides: 91
VLDL: 18

## 2011-06-16 LAB — SEDIMENTATION RATE: Sed Rate: 12

## 2011-06-16 LAB — RPR: RPR Ser Ql: NONREACTIVE

## 2011-06-16 LAB — URINE MICROSCOPIC-ADD ON

## 2011-06-16 LAB — HEMOGLOBIN A1C
Hgb A1c MFr Bld: 5.9
Mean Plasma Glucose: 133

## 2011-06-16 LAB — MAGNESIUM: Magnesium: 2.2

## 2011-06-16 LAB — APTT: aPTT: 31

## 2011-06-16 LAB — PHOSPHORUS: Phosphorus: 3.4

## 2013-06-07 ENCOUNTER — Inpatient Hospital Stay (HOSPITAL_COMMUNITY)
Admission: AD | Admit: 2013-06-07 | Discharge: 2013-06-11 | DRG: 310 | Disposition: A | Discharge status: Skilled Nursing Facility | Payer: Medicare Other | Source: Ambulatory Visit | Attending: Internal Medicine | Admitting: Internal Medicine

## 2013-06-07 ENCOUNTER — Encounter (HOSPITAL_COMMUNITY): Payer: Self-pay | Admitting: Cardiology

## 2013-06-07 ENCOUNTER — Inpatient Hospital Stay (HOSPITAL_COMMUNITY): Payer: Medicare Other

## 2013-06-07 DIAGNOSIS — Z79899 Other long term (current) drug therapy: Secondary | ICD-10-CM

## 2013-06-07 DIAGNOSIS — E119 Type 2 diabetes mellitus without complications: Secondary | ICD-10-CM | POA: Diagnosis present

## 2013-06-07 DIAGNOSIS — I4891 Unspecified atrial fibrillation: Principal | ICD-10-CM | POA: Diagnosis present

## 2013-06-07 DIAGNOSIS — Z66 Do not resuscitate: Secondary | ICD-10-CM | POA: Diagnosis present

## 2013-06-07 DIAGNOSIS — J4489 Other specified chronic obstructive pulmonary disease: Secondary | ICD-10-CM | POA: Diagnosis present

## 2013-06-07 DIAGNOSIS — Z7982 Long term (current) use of aspirin: Secondary | ICD-10-CM

## 2013-06-07 DIAGNOSIS — I714 Abdominal aortic aneurysm, without rupture, unspecified: Secondary | ICD-10-CM | POA: Diagnosis present

## 2013-06-07 DIAGNOSIS — Z87891 Personal history of nicotine dependence: Secondary | ICD-10-CM

## 2013-06-07 DIAGNOSIS — Z8673 Personal history of transient ischemic attack (TIA), and cerebral infarction without residual deficits: Secondary | ICD-10-CM

## 2013-06-07 DIAGNOSIS — I10 Essential (primary) hypertension: Secondary | ICD-10-CM | POA: Diagnosis present

## 2013-06-07 DIAGNOSIS — E785 Hyperlipidemia, unspecified: Secondary | ICD-10-CM | POA: Diagnosis present

## 2013-06-07 DIAGNOSIS — M4120 Other idiopathic scoliosis, site unspecified: Secondary | ICD-10-CM | POA: Diagnosis present

## 2013-06-07 DIAGNOSIS — R627 Adult failure to thrive: Secondary | ICD-10-CM | POA: Diagnosis present

## 2013-06-07 DIAGNOSIS — M810 Age-related osteoporosis without current pathological fracture: Secondary | ICD-10-CM | POA: Diagnosis present

## 2013-06-07 DIAGNOSIS — I6789 Other cerebrovascular disease: Secondary | ICD-10-CM | POA: Diagnosis present

## 2013-06-07 DIAGNOSIS — Z7902 Long term (current) use of antithrombotics/antiplatelets: Secondary | ICD-10-CM

## 2013-06-07 DIAGNOSIS — H269 Unspecified cataract: Secondary | ICD-10-CM | POA: Diagnosis present

## 2013-06-07 DIAGNOSIS — J449 Chronic obstructive pulmonary disease, unspecified: Secondary | ICD-10-CM | POA: Diagnosis present

## 2013-06-07 DIAGNOSIS — F411 Generalized anxiety disorder: Secondary | ICD-10-CM | POA: Diagnosis present

## 2013-06-07 DIAGNOSIS — I6529 Occlusion and stenosis of unspecified carotid artery: Secondary | ICD-10-CM | POA: Diagnosis present

## 2013-06-07 DIAGNOSIS — E039 Hypothyroidism, unspecified: Secondary | ICD-10-CM | POA: Diagnosis present

## 2013-06-07 HISTORY — DX: Cerebral infarction, unspecified: I63.9

## 2013-06-07 HISTORY — DX: Hypothyroidism, unspecified: E03.9

## 2013-06-07 HISTORY — DX: Age-related osteoporosis without current pathological fracture: M81.0

## 2013-06-07 HISTORY — DX: Hyperlipidemia, unspecified: E78.5

## 2013-06-07 HISTORY — DX: Chronic obstructive pulmonary disease, unspecified: J44.9

## 2013-06-07 HISTORY — DX: Essential (primary) hypertension: I10

## 2013-06-07 HISTORY — DX: Type 2 diabetes mellitus without complications: E11.9

## 2013-06-07 HISTORY — DX: Unspecified cataract: H26.9

## 2013-06-07 LAB — CBC WITH DIFFERENTIAL/PLATELET
Eosinophils Absolute: 0.2 10*3/uL (ref 0.0–0.7)
Hemoglobin: 15.6 g/dL — ABNORMAL HIGH (ref 12.0–15.0)
Lymphocytes Relative: 34 % (ref 12–46)
Lymphs Abs: 3.9 10*3/uL (ref 0.7–4.0)
MCH: 32.6 pg (ref 26.0–34.0)
Monocytes Relative: 7 % (ref 3–12)
Neutrophils Relative %: 57 % (ref 43–77)
Platelets: 240 10*3/uL (ref 150–400)
RBC: 4.79 MIL/uL (ref 3.87–5.11)
WBC: 11.4 10*3/uL — ABNORMAL HIGH (ref 4.0–10.5)

## 2013-06-07 LAB — COMPREHENSIVE METABOLIC PANEL
ALT: 8 U/L (ref 0–35)
Albumin: 3.6 g/dL (ref 3.5–5.2)
Alkaline Phosphatase: 53 U/L (ref 39–117)
Calcium: 9.7 mg/dL (ref 8.4–10.5)
Potassium: 4.3 mEq/L (ref 3.5–5.1)
Sodium: 140 mEq/L (ref 135–145)
Total Protein: 7.2 g/dL (ref 6.0–8.3)

## 2013-06-07 LAB — TSH: TSH: 4.151 u[IU]/mL (ref 0.350–4.500)

## 2013-06-07 LAB — DIGOXIN LEVEL: Digoxin Level: 0.6 ng/mL — ABNORMAL LOW (ref 0.8–2.0)

## 2013-06-07 LAB — PRO B NATRIURETIC PEPTIDE: Pro B Natriuretic peptide (BNP): 543.8 pg/mL — ABNORMAL HIGH (ref 0–450)

## 2013-06-07 LAB — TROPONIN I: Troponin I: <0.3 ng/mL (ref <0.30)

## 2013-06-07 LAB — MAGNESIUM: Magnesium: 2.2 mg/dL (ref 1.5–2.5)

## 2013-06-07 MED ORDER — ACETAMINOPHEN 325 MG PO TABS
650.0000 mg | ORAL_TABLET | Freq: Four times a day (QID) | ORAL | Status: DC | PRN
  Administered 2013-06-07 – 2013-06-09 (×4): 650 mg via ORAL
  Filled 2013-06-07 (×4): qty 2

## 2013-06-07 MED ORDER — SODIUM CHLORIDE 0.9 % IV SOLN: INTRAVENOUS | Status: DC

## 2013-06-07 MED ORDER — LEVOTHYROXINE SODIUM 75 MCG PO TABS
75.0000 ug | ORAL_TABLET | Freq: Every day | ORAL | Status: DC
  Administered 2013-06-08 – 2013-06-11 (×4): 75 ug via ORAL
  Filled 2013-06-07 (×5): qty 1

## 2013-06-07 MED ORDER — CLOPIDOGREL BISULFATE 75 MG PO TABS
75.0000 mg | ORAL_TABLET | Freq: Every day | ORAL | Status: DC
  Filled 2013-06-07: qty 1

## 2013-06-07 MED ORDER — DILTIAZEM HCL 100 MG IV SOLR
5.0000 mg/h | INTRAVENOUS | Status: DC
  Filled 2013-06-07: qty 100

## 2013-06-07 MED ORDER — DILTIAZEM HCL 60 MG PO TABS
60.0000 mg | ORAL_TABLET | Freq: Four times a day (QID) | ORAL | Status: DC
  Filled 2013-06-07 (×3): qty 1

## 2013-06-07 MED ORDER — DILTIAZEM HCL 30 MG PO TABS
30.0000 mg | ORAL_TABLET | Freq: Four times a day (QID) | ORAL | Status: DC
  Administered 2013-06-07 – 2013-06-08 (×6): 30 mg via ORAL
  Filled 2013-06-07 (×11): qty 1

## 2013-06-07 MED ORDER — ACETAMINOPHEN 650 MG RE SUPP: 650.0000 mg | Freq: Four times a day (QID) | RECTAL | Status: DC | PRN

## 2013-06-07 MED ORDER — DIGOXIN 0.0625 MG HALF TABLET
0.0625 mg | ORAL_TABLET | Freq: Every day | ORAL | Status: DC
  Filled 2013-06-07: qty 1

## 2013-06-07 MED ORDER — ASPIRIN EC 325 MG PO TBEC
325.0000 mg | DELAYED_RELEASE_TABLET | Freq: Every day | ORAL | Status: DC
  Administered 2013-06-07 – 2013-06-11 (×5): 325 mg via ORAL
  Filled 2013-06-07 (×5): qty 1

## 2013-06-07 MED ORDER — ENOXAPARIN SODIUM 40 MG/0.4ML ~~LOC~~ SOLN
40.0000 mg | SUBCUTANEOUS | Status: DC
  Administered 2013-06-07 – 2013-06-10 (×4): 40 mg via SUBCUTANEOUS
  Filled 2013-06-07 (×5): qty 0.4

## 2013-06-07 MED ORDER — SENNOSIDES-DOCUSATE SODIUM 8.6-50 MG PO TABS
1.0000 | ORAL_TABLET | Freq: Every evening | ORAL | Status: DC | PRN
  Filled 2013-06-07: qty 1

## 2013-06-07 NOTE — Consult Note (Signed)
HPI: 79 year old female for evaluation of atrial fibrillation. Echocardiogram in March of 2003 showed normal LV function. Nuclear study also negative at that time. Patient resides in independent living. Over the past 2 weeks she has noticed increased dyspnea on exertion. No orthopnea, PND or pedal edema. Question vague chest pain earlier today. Occasional palpitations x2 weeks as well. Also with dizziness. No syncope. Seen at her primary care physician's office and noted to be in atrial fibrillation and admitted. Cardiology asked to evaluate. She has apparently been felt not to be a Coumadin candidate previously because of potential eye bleeding and previous falls.  No prescriptions prior to admission    Allergies  Allergen Reactions  . Claritin [Loratadine]     Past Medical History  Diagnosis Date  . Hypertension   . CVA (cerebral infarction)   . Hyperlipidemia   . Hypothyroid   . Diabetes mellitus   . COPD (chronic obstructive pulmonary disease)   . Osteoporosis   . Cataracts, bilateral     Past Surgical History  Procedure Laterality Date  . Tonsillectomy      History   Social History  . Marital Status: Widowed    Spouse Name: N/A    Number of Children: 3  . Years of Education: N/A   Occupational History  . Not on file.   Social History Main Topics  . Smoking status: Former Games developer  . Smokeless tobacco: Not on file  . Alcohol Use: No  . Drug Use: Not on file  . Sexual Activity: Not on file   Other Topics Concern  . Not on file   Social History Narrative  . No narrative on file    Family History  Problem Relation Age of Onset  . Heart disease      No family history    ROS:  no fevers or chills, productive cough, hemoptysis, dysphasia, odynophagia, melena, hematochezia, dysuria, hematuria, rash, seizure activity, orthopnea, PND, pedal edema, claudication. Remaining systems are negative.  Physical Exam:   Blood pressure 128/93, pulse 90, temperature 97.7  F (36.5 C), temperature source Oral, height 5\' 4"  (1.626 m), weight 133 lb (60.328 kg), SpO2 99.00%.  General:  Well developed/frail in NAD Skin warm/dry Patient not depressed No peripheral clubbing Back-normal HEENT-normal/normal eyelids Neck supple/normal carotid upstroke bilaterally; no bruits; no JVD; no thyromegaly chest - CTA/ normal expansion CV - irregular/tachycardic/normal S1 and S2; no murmurs, rubs or gallops;  PMI nondisplaced Abdomen -NT/ND, no HSM, no mass, + bowel sounds, no bruit 2+ femoral pulses, no bruits Ext-no edema/chords, diminished distal pulses Neuro-grossly nonfocal  ECG atrial fibrillation with a rapid ventricular response, low voltage, nonspecific ST changes.  No results found for this or any previous visit (from the past 48 hour(s)).  No results found.  Assessment/Plan 1 atrial fibrillation-the patient is in atrial fibrillation today. This is most likely contributing to her dyspnea on exertion. The duration is unclear but her symptoms have been present for 2 weeks. Would continue Cardizem. Discontinue digoxin. Follow heart rate and increase medications as needed. She has multiple embolic risk factors including age greater than 31, prior CVA, diet-controlled diabetes mellitus and hypertension. However she has been felt not to be a Coumadin candidate previously because of recurrent falls and also increased risk of bleeding from previous eye surgery per her daughter. Would treat with aspirin. Given age would discontinue Plavix. I had lengthy discussions with she and her daughter and explained the risk of CVA with aspirin compared to Coumadin and they understand  that risk. Note she is a no CODE BLUE. She wants conservative measures if possible. Hopefully her symptoms will improve with rate control. She is not a candidate for cardioversion if she is not anticoagulated. Check echocardiogram and TSH. 2 hypertension-continue present medications.  Olga Millers  MD 06/07/2013, 4:45 PM

## 2013-06-07 NOTE — H&P (Signed)
Vital Signs  Entered weight:  133  lbs., Calculated Weight: 133 lbs., ( 60.33 kg) Height: 62.25 in., ( 158.12 cm) Pulse rate: 112 Pulse rhythm: irregular  Blood Pressure #1: 150 / 90 mm Hg   Diabetes Hgb A1C: 5.7,  BMI: 24.13 BSA: 1.61 Wt Chg: -4 lbs since 12/04/2012  Vitals entered by: Donetta Potts CMA on June 07, 2013 1:57 PM     PHQ-2 Depression Screening   1. During the past month, have you often been bothered by feeling down, depressed, or hopeless?  serveral days 2. During the past month, have you often been bothered by little interest or pleasure in doing things? serveral days SCORE:  2  Falls Assessment  During the past 12 months, have you fallen 2 or more times? No Have you been injured due to a fall? No   Risk Factors:   Smoked Tobacco Use:  Never smoker Drug use:  no HIV high-risk behavior:  no Caffeine use:  0 drinks per day Alcohol use:  no Exercise:  no Seatbelt use:  100 % Sun Exposure:  rarely  Previous Tobacco Use: Signed On - 10/10/2012 Smoked Tobacco Use:  never smoker Drug use:  no HIV high-risk behavior:  no Caffeine use:  0 drinks per day  Previous Alcohol Use: Signed On - 09/09/2009 Alcohol use:  no Exercise:  no Seatbelt use:  100 % Sun Exposure:  rarely  History of Present Illness  Reason for visit: Routine Follow-up Chief Complaint: Pt states her "heart hurts" but not having chest pains.  Heart feels irregular History of Present Illness: 79 F here for follow up MMp.  Known H/O PAF  Heart has been speeding up and slowing down. Some chest pains.  Had episode this am of increased L arm pain and less able to move it.  currently strength is fine but poor ROM due to pain.  hearing clinic once per month   Review of Systems  General:       Denies fevers, chills, headache, sweats, anorexia, fatigue, malaise, weight loss.        dizzy Cardiovascular:       Complains of chest pains, dyspnea on exertion.        Denies  claudication, palpitations, syncope, orthopnea, PND, peripheral edema.   Respiratory:       Denies cough, dyspnea, excessive sputum, hemoptysis, wheezing.   Gastrointestinal:       Denies nausea, vomiting, diarrhea, constipation, heartburn, change in bowel habits, abdominal pain, melena, hematochezia, jaundice.   Musculoskeletal:       Denies back pain, joint pain, joint swelling, muscle cramps, muscle weakness, stiffness, arthritis.   Skin:       Denies rash, itching, dryness, suspicious lesions.   Neurologic:       Denies radicular symptoms, weakness, paresthesias, seizures, syncope, tremors, vertigo, dizziness, gait instability.    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  LE Burning and Pain - ? Neuropathy Surgical History (reviewed -  no changes required): Ectasia & dilatation of ab aorta B cataracts  Family History (reviewed - no changes required): Father deceased- cerebral hemorrhage. Mother deceased- cancer. Social History (reviewed - no changes required): Widowed, children.  Son in late 36s brain damaged in motorcycle accident at 77yo; in [place]. Retired.  Spent many yrs overseas. [place].  Not Driving  Family History Summary:     Reviewed history Last on 06/05/2012 and no changes required:06/07/2013 Mother Baker Pierini.) - Has Family History of Other Cancer - Entered On: 06/07/2013  General Comments - FH: Father deceased- cerebral hemorrhage. Mother deceased- cancer.  Social History:    Reviewed history from 09/09/2009 and no  changes required:       Widowed, children.  Son in late 59s brain damaged in motorcycle accident at 77yo; in [place].       Retired.  Spent many yrs overseas.       [place].              Not Driving   Physical Exam  General appearance: well nourished, well hydrated, no acute distress.  having more issues with sight and hearing.    Eyes  External: conjunctivae and lids normal Pupils: equal, round, reactive to light and accommodation  Ears, Nose and Throat  External ears: normal, no lesions or deformities External nose: normal, no lesions or deformities Otoscopic: canals clear, tympanic membranes intact, no fluid Hearing: Very HOH.  Aides Nasal: mucosa, septum, and turbinates normal Dental: good dentition Pharynx: tongue normal, protrudes mid line,  posterior pharynx without erythema or exudate  Neck  Neck: supple, no masses, trachea midline Thyroid: no nodules, masses, tenderness, or enlargement  Respiratory  Respiratory effort: no intercostal retractions or use of accessory muscles Auscultation: no rales, rhonchi, or wheezes  Cardiovascular  Palpation: no thrill or palpable murmurs, no displacement of PMI Auscultation: irregular rate and rhythm and Tachy Carotid arteries: pulses 2+, symmetric, no bruits Abdominal aorta: no enlargement or bruits Pedal pulses: + femoral pulse on L and + L pedal pulse Periph. circulation: no cyanosis, clubbing, edema  Gastrointestinal  Abdomen: soft, non-tender, no masses, bowel sounds normal Liver and spleen: no enlargement or nodularity  Lymphatic  Neck: no cervical adenopathy  Musculoskeletal  Gait and station: walks with cane-able to get up and down slowly.  hard to climb onto table. Digits and nails: no clubbing, cyanosis, petechiae, or nodes.  Mild tremor. Head and neck: normal alignment and mobility Spine, ribs, pelvis: tender at spots RUE: normal ROM and strength, no joint enlargement or tenderness LUE: normal ROM and  strength, no joint enlargement or tenderness RLE: normal ROM and strength, no joint enlargement or tenderness LLE: normal ROM and strength, no joint enlargement or tenderness  Skin  Inspection: diffuse macular papular rash with some excoriations Palpation: no subcutaneous nodules or induration  Neurologic  Cranial nerves: II - XII grossly intact Sensation: intact to touch, vibration  Mental Status Exam  Judgment, insight: intact Orientation: oriented to time, place, and person Memory: intact Mood and affect: Normal Mood   Impression & Recommendations:  Problem # 1:  PAROXYSMAL ATRIAL FIBRILLATION (ICD-427.31) (ICD10-I48.0)  Her updated medication list for this problem includes:    Digoxin 0.125 Mg Tabs (Digoxin) .Marland Kitchen... 1/2 tab po qd    Plavix 75 Mg Tabs (Clopidogrel bisulfate) .Marland Kitchen... 1 tab po qd  PSVT/PAF--not coumadin candidate,    Check EKG - AFib RVR 120. She is dizzy.  Lightheaded and feels poor. Saw Dr Elease Hashimoto 2003. last ECHO 3/10/3  Nml EF. Mild LVH. EF 65-75%  continue current meds and will need Dig level checked. 3/4/3 (-) Stress cardiolyte.  Currently in Afib RVR Dr Cecilie Kicks took her off coumadin in 2003 b/c her eye issues and said she should never be on it. We discussed this ams issues may have been a CVA.  We discussed admission and W/Up vrs outpt management. She elects inpt due to feeling poorly. Will get HR down and consult cards. get ECHO and Carotids. Decide medicines.  Daughter and POADarrall Dears   [phone] Orders: EKG 12 Leads ([id number])   Problem # 2:  ABDOMINAL AORTIC ANEURYSM (ICD-441.4) (ICD10-I71.4)  01/2010. Abd Aorta Duplex US (-) AAA but atherosclerosis  No further checks needed.   Problem # 3:  ANXIETY (ICD-300.00) (ICD10-F41.9)  What keeps her gowing is her disabled son who resides in a Nursing home. She is doing the best she can.. She does not want any other meds. She is having hard time getting to see him due to feeling  aweful.  Problem # 4:  ABNORMALITY OF GAIT (ICD-781.2) (ICD10-R26.9)  Encouraged to use Walker > 4 pronged Cane > Cane She is doing her best to avoid falls. PT/OT/CW.     Problem # 5:  CVA (ICD-434.91) (ICD10-I63.9)  Her updated medication list for this problem includes:    Plavix 75 Mg Tabs (Clopidogrel bisulfate) .Marland Kitchen... 1 tab po qd  L Medial temp occipital infarct and L Thalamic CVA 09/2007 with 40-60% L Common Carotid artery stenosis,    Continue to keep RF to a min. Check carotids again due to Sxs as she has been Asxatic since 2009.   Problem # 6:  HYPOTHYROIDISM (ICD-244.9) (ICD10-E03.9)  Her updated medication list for this problem includes:    Synthroid 75 Mcg Tabs (Levothyroxine sodium) .Marland Kitchen... 1 po qd  Labs Reviewed: TSH: 4.34 (12/04/2012)    HgBA1c: 5.7 (06/07/2013) Chol: 261 (12/06/2011)   LDL: 188 (12/06/2011)     check #s.   Problem # 7:  HYPERTENSION (ICD-401.9) (ICD10-I10)  Her updated medication list for this problem includes:    Norvasc 2.5 Mg Tabs (Amlodipine besylate) .Marland Kitchen... 1 po qd  BP today: 150/90 Prior BP: 110/62 (12/04/2012)  Labs Reviewed: Creat: 0.8 (12/04/2012) Chol: 261 (12/06/2011)   LDL: 188 (12/06/2011)     Td Vaccine: done. (02/06/2006 2:12:09 PM) Pneumovax: done (07/26/2004 2:12:09 PM) Zostavax: done. (02/06/2006 2:12:09 PM)   Complete Medication List: 1)  Digoxin 0.125 Mg Tabs (Digoxin) .... 1/2 tab po qd 2)  Fosamax 70 Mg Tabs (Alendronate sodium) .Marland Kitchen.. 1 tab po q wk 3)  Norvasc 2.5 Mg Tabs (Amlodipine besylate) .Marland Kitchen.. 1 po qd 4)  Plavix 75 Mg Tabs (Clopidogrel bisulfate) .Marland Kitchen.. 1 tab po qd 5)  Synthroid 75 Mcg Tabs (Levothyroxine sodium) .Marland Kitchen.. 1 po qd  Other Orders: HGB A1C ([id number]) Fluvirin Intramuscular Injectable ([id number])

## 2013-06-08 ENCOUNTER — Encounter (HOSPITAL_COMMUNITY): Payer: Self-pay | Admitting: Emergency Medicine

## 2013-06-08 DIAGNOSIS — I059 Rheumatic mitral valve disease, unspecified: Secondary | ICD-10-CM

## 2013-06-08 DIAGNOSIS — I6350 Cerebral infarction due to unspecified occlusion or stenosis of unspecified cerebral artery: Secondary | ICD-10-CM

## 2013-06-08 LAB — TROPONIN I
Troponin I: <0.3 ng/mL (ref <0.30)
Troponin I: <0.3 ng/mL (ref <0.30)

## 2013-06-08 MED ORDER — ZOLPIDEM TARTRATE 5 MG PO TABS
5.0000 mg | ORAL_TABLET | Freq: Every evening | ORAL | Status: DC | PRN
  Administered 2013-06-08 – 2013-06-10 (×3): 5 mg via ORAL
  Filled 2013-06-08 (×3): qty 1

## 2013-06-08 NOTE — Progress Notes (Signed)
VASCULAR LAB PRELIMINARY  PRELIMINARY  PRELIMINARY  PRELIMINARY  Carotid Dopplers completed.    Preliminary report:  1-39% ICA stenosis.  Vertebral artery flow is antegrade.  Alison Singleton, RVT 06/08/2013, 6:16 PM

## 2013-06-08 NOTE — Progress Notes (Signed)
Patient ID: Alison Singleton, female   DOB: 14-Nov-1916, 79 y.o.   MRN: [id number]   SUBJECTIVE: Patient is feeling better today. There is better heart rate control.   Filed Vitals:   06/07/13 1613 06/07/13 1828 06/07/13 2048 06/08/13 0454  BP:  131/87 100/75 127/80  Pulse:   75 90  Temp:   97.7 F (36.5 C) 97.4 F (36.3 C)  TempSrc:   Oral Oral  Resp:   17 21  Height:      Weight: 133 lb (60.328 kg)   130 lb 1.1 oz (59 kg)  SpO2:   95% 93%   No intake or output data in the 24 hours ending 06/08/13 0949  LABS: Basic Metabolic Panel:  Recent Labs  40/98/11 1703  NA 140  K 4.3  CL 103  CO2 26  GLUCOSE 82  BUN 15  CREATININE 0.73  CALCIUM 9.7  MG 2.2  PHOS 3.7   Liver Function Tests:  Recent Labs  06/07/13 1703  AST 16  ALT 8  ALKPHOS 53  BILITOT 0.6  PROT 7.2  ALBUMIN 3.6   No results found for this basename: LIPASE, AMYLASE,  in the last 72 hours CBC:  Recent Labs  06/07/13 1703  WBC 11.4*  NEUTROABS 6.5  HGB 15.6*  HCT 44.7  MCV 93.3  PLT 240   Cardiac Enzymes:  Recent Labs  06/07/13 1703 06/07/13 2255 06/08/13 0550  TROPONINI <0.30 <0.30 <0.30   BNP: No components found with this basename: POCBNP,  D-Dimer: No results found for this basename: DDIMER,  in the last 72 hours Hemoglobin A1C: No results found for this basename: HGBA1C,  in the last 72 hours Fasting Lipid Panel: No results found for this basename: CHOL, HDL, LDLCALC, TRIG, CHOLHDL, LDLDIRECT,  in the last 72 hours Thyroid Function Tests:  Recent Labs  06/07/13 1703  TSH 4.151    RADIOLOGY: Ct Head Wo Contrast  06/07/2013   CLINICAL DATA:  79 year old female with decreased use of the left upper extremity.  EXAM: CT HEAD WITHOUT CONTRAST  TECHNIQUE: Contiguous axial images were obtained from the base of the skull through the vertex without intravenous contrast.  COMPARISON:  Brain MRI 10/13/2007 and earlier.  FINDINGS: Visualized paranasal sinuses and mastoids are clear. No  acute osseous abnormality identified. Visualized orbits and scalp soft tissues are within normal limits. Calcified atherosclerosis at the skull base.  Cerebral volume is within normal limits for age. No midline shift, mass effect, or evidence of intracranial mass lesion. No ventriculomegaly. No acute intracranial hemorrhage identified. Small hypodense focus near the caudate nucleus and genu of the left internal capsule is new since 2009 compatible with lacunar infarct. No evidence of cortically based acute infarction identified. No suspicious intracranial vascular hyperdensity.  IMPRESSION: Mild for age chronic small vessel disease. No acute intracranial abnormality.   Electronically Signed   By: Augusto Gamble M.D.   On: 06/07/2013 19:36    PHYSICAL EXAM  patient is feeling better this morning. Cardiac exam reveals an S1 and S2. The rhythm is irregular.  TELEMETRY: I have reviewed telemetry today June 08, 2013. There is atrial fibrillation. There is better rate control. There is slow rate at times with no symptoms.   ASSESSMENT AND PLAN:    Atrial fibrillation    There is better heart rate control with a small amount of Cardizem. Unfortunately it appears that she will have problems with bradycardia tachycardia. Hopefully however she will be asymptomatic on a small dose  of diltiazem. Plan to change her to long-acting diltiazem 120 mg daily starting tomorrow morning.  Alison Singleton 06/08/2013 9:49 AM

## 2013-06-08 NOTE — Progress Notes (Signed)
Notified Dr. Wylene Simmer that pt's HR has been ranging from 30's-low 100's since last night. While at rest, HR seems to stay between 40's-50's for the most part. No mental status changes. Pt resting comfortably. Will continue to monitor.

## 2013-06-08 NOTE — Progress Notes (Signed)
Subjective: Had an uneventful evening.  ON Cardizem 30mg  q6h.  HR upper 30's (asymptomatic) to 110.  Ate her breakfast and feels a bit more relaxed and less tired this AM.  Echo result pending (she states that she went down for it yesterday).  Objective: Vital signs in last 24 hours: Temp:  [97.4 F (36.3 C)-97.7 F (36.5 C)] 97.4 F (36.3 C) (09/13 0454) Pulse Rate:  [75-90] 90 (09/13 0454) Resp:  [17-21] 21 (09/13 0454) BP: (100-131)/(75-93) 127/80 mmHg (09/13 0454) SpO2:  [93 %-99 %] 93 % (09/13 0454) Weight:  [59 kg (130 lb 1.1 oz)-60.328 kg (133 lb)] 59 kg (130 lb 1.1 oz) (09/13 0454) Weight change:  Last BM Date: 06/07/13  Intake/Output from previous day:   Intake/Output this shift:    General appearance: alert, cooperative and appears stated age Throat: lips, mucosa, and tongue normal; teeth and gums normal Neck: no adenopathy, no carotid bruit, no JVD, supple, symmetrical, trachea midline and thyroid not enlarged, symmetric, no tenderness/mass/nodules Resp: clear to auscultation bilaterally Cardio: irregularly irregular rhythm Extremities: extremities normal, atraumatic, no cyanosis or edema Pulses: 2+ and symmetric Skin: Skin color, texture, turgor normal. No rashes or lesions  Lab Results:  Recent Labs  06/07/13 1703  WBC 11.4*  HGB 15.6*  HCT 44.7  PLT 240   BMET  Recent Labs  06/07/13 1703  NA 140  K 4.3  CL 103  CO2 26  GLUCOSE 82  BUN 15  CREATININE 0.73  CALCIUM 9.7    Studies/Results: Ct Head Wo Contrast  06/07/2013   CLINICAL DATA:  79 year old female with decreased use of the left upper extremity.  EXAM: CT HEAD WITHOUT CONTRAST  TECHNIQUE: Contiguous axial images were obtained from the base of the skull through the vertex without intravenous contrast.  COMPARISON:  Brain MRI 10/13/2007 and earlier.  FINDINGS: Visualized paranasal sinuses and mastoids are clear. No acute osseous abnormality identified. Visualized orbits and scalp soft  tissues are within normal limits. Calcified atherosclerosis at the skull base.  Cerebral volume is within normal limits for age. No midline shift, mass effect, or evidence of intracranial mass lesion. No ventriculomegaly. No acute intracranial hemorrhage identified. Small hypodense focus near the caudate nucleus and genu of the left internal capsule is new since 2009 compatible with lacunar infarct. No evidence of cortically based acute infarction identified. No suspicious intracranial vascular hyperdensity.  IMPRESSION: Mild for age chronic small vessel disease. No acute intracranial abnormality.   Electronically Signed   By: Augusto Gamble M.D.   On: 06/07/2013 19:36    Medications:  I have reviewed the patient's current medications. Scheduled: . aspirin EC  325 mg Oral Daily  . diltiazem  30 mg Oral Q6H  . enoxaparin (LOVENOX) injection  40 mg Subcutaneous Q24H  . levothyroxine  75 mcg Oral QAC breakfast   Continuous: . sodium chloride     ZOX:[id number], acetaminophen, senna-docusate  Assessment/Plan: Problem # 1: PAROXYSMAL ATRIAL FIBRILLATION (ICD-427.31) (ICD10-I48.0) On Cardizem 30mg  q6h currently, Echo result pending.  I would expect that she will be put on a daily type medication soon.  ASA only due top falls/bleed risk.  [place] Cards following.  Problem # 2: ABDOMINAL AORTIC ANEURYSM (ICD-441.4) (ICD10-I71.4) Atherosclerosis.  Problem # 3: ANXIETY (ICD-300.00) (ICD10-F41.9) Doing well this AM, no new issues  Problem # 4: ABNORMALITY OF GAIT (ICD-781.2) (ICD10-R26.9) CT head showed aging without acute stroke.  PT/OT consults pending.  Problem # 6: HYPOTHYROIDISM (ICD-244.9) (ICD10-E03.9) TSH 4.1, appropriate.  Problem # 7:  HYPERTENSION (ICD-401.9) (ICD10-I10) Controlled.  LOS: 1 day   Aithan Farrelly W 06/08/2013, 8:54 AM

## 2013-06-08 NOTE — Progress Notes (Signed)
  Echocardiogram 2D Echocardiogram has been performed.  Georgian Co 06/08/2013, 10:57 AM

## 2013-06-08 NOTE — Evaluation (Signed)
Physical Therapy Evaluation Patient Details Name: Alison Singleton MRN: [id number] DOB: [birth year] Today's Date: 06/08/2013 Time: 7846-9629 PT Time Calculation (min): 25 min  PT Assessment / Plan / Recommendation History of Present Illness  HPI: 79 year old female for evaluation of atrial fibrillation. Echocardiogram in March of 2003 showed normal LV function. Nuclear study also negative at that time. Patient resides in independent living. Over the past 2 weeks she has noticed increased dyspnea on exertion. No orthopnea, PND or pedal edema. Question vague chest pain earlier today. Occasional palpitations x2 weeks as well. Also with dizziness. No syncope. Seen at her primary care physician's office and noted to be in atrial fibrillation and admitted.    Clinical Impression  Patient demonstrates deficits in functional mobility as indicated below. Pt will benefit from continued skilled PT to address deficits and maximize independence. Will continue to see as indicated. Patient will benefit from continued Skilled PT upon discharge, patient does need assist for OOB mobility at this time and would benefit from PT upon discharge. Rec SNF if OOB assist and PT is not available to patient with assisted living arrangements.    PT Assessment  Patient needs continued PT services    Follow Up Recommendations  SNF(may consider HHPT if OOB assist can be provided)          Equipment Recommendations  None recommended by PT       Frequency Min 3X/week    Precautions / Restrictions Precautions Precautions: Fall   Pertinent Vitals/Pain No pain at this time      Mobility  Bed Mobility Bed Mobility: Supine to Sit;Sitting - Scoot to Edge of Bed;Sit to Supine Supine to Sit: 5: Supervision Sitting - Scoot to Edge of Bed: 5: Supervision Sit to Supine: 5: Supervision Details for Bed Mobility Assistance: No assist needed Transfers Transfers: Sit to Stand;Stand to Sit Sit to Stand: 5: Supervision Stand  to Sit: 5: Supervision Details for Transfer Assistance: VCs for hand placement and safety with use of assistive device Ambulation/Gait Ambulation/Gait Assistance: 3: Mod assist Ambulation Distance (Feet): 200 Feet Assistive device: Rolling walker Ambulation/Gait Assistance Details: patient very impulsive and at times very unsteady with ambulation, RLE contiuously catching causing notable LOBs requiring assist to prevent fall. patient confirms that she feels more off balance and unsteady Gait Pattern: Step-through pattern;Trunk flexed;Narrow base of support Gait velocity: impulsive (fast) General Gait Details: Unsteady and unsafe at times with ambulation        PT Diagnosis: Difficulty walking;Generalized weakness  PT Problem List: Decreased strength;Decreased activity tolerance;Decreased balance;Decreased mobility;Decreased coordination;Decreased cognition;Decreased knowledge of use of DME PT Treatment Interventions: DME instruction;Gait training;Functional mobility training;Therapeutic activities;Therapeutic exercise;Balance training;Patient/family education     PT Goals(Current goals can be found in the care plan section) Acute Rehab PT Goals Patient Stated Goal: to go back to [facility] PT Goal Formulation: With patient Time For Goal Achievement: 06/22/13 Potential to Achieve Goals: Good  Visit Information  Last PT Received On: 06/08/13 Assistance Needed: +1 History of Present Illness: HPI: 79 year old female for evaluation of atrial fibrillation. Echocardiogram in March of 2003 showed normal LV function. Nuclear study also negative at that time. Patient resides in independent living. Over the past 2 weeks she has noticed increased dyspnea on exertion. No orthopnea, PND or pedal edema. Question vague chest pain earlier today. Occasional palpitations x2 weeks as well. Also with dizziness. No syncope. Seen at her primary care physician's office and noted to be in atrial fibrillation  and admitted. Cardiology asked to evaluate.  She has apparently been felt not to be a Coumadin candidate previously because of potential eye bleeding and previous falls.       Prior Functioning  Home Living Family/patient expects to be discharged to:: Assisted living Home Equipment: Walker - 4 wheels Prior Function Level of Independence: Independent with assistive device(s) Communication Communication: HOH Dominant Hand: Right    Cognition  Cognition Arousal/Alertness: Awake/alert Behavior During Therapy: WFL for tasks assessed/performed Overall Cognitive Status: No family/caregiver present to determine baseline cognitive functioning    Extremity/Trunk Assessment Upper Extremity Assessment Upper Extremity Assessment: Generalized weakness Lower Extremity Assessment Lower Extremity Assessment: Generalized weakness   Balance High Level Balance High Level Balance Activites: Side stepping;Backward walking;Direction changes;Turns High Level Balance Comments: difficulty with turns, requires increased assist to prevent fall  End of Session PT - End of Session Equipment Utilized During Treatment: Gait belt Activity Tolerance: Patient tolerated treatment well Patient left: in bed;with call bell/phone within reach;with bed alarm set Nurse Communication: Mobility status  GP     Fabio Asa 06/08/2013, 11:34 AM Charlotte Crumb, PT DPT  [phone]

## 2013-06-09 LAB — BASIC METABOLIC PANEL
BUN: 16 mg/dL (ref 6–23)
CO2: 20 mEq/L (ref 19–32)
Calcium: 8.8 mg/dL (ref 8.4–10.5)
Chloride: 103 mEq/L (ref 96–112)
Creatinine, Ser: 0.69 mg/dL (ref 0.50–1.10)
GFR calc Af Amer: 84 mL/min — ABNORMAL LOW (ref >90)
GFR calc non Af Amer: 72 mL/min — ABNORMAL LOW (ref >90)
Glucose, Bld: 79 mg/dL (ref 70–99)
Potassium: 3.6 mEq/L (ref 3.5–5.1)
Sodium: 136 mEq/L (ref 135–145)

## 2013-06-09 LAB — CBC
MCH: 31.5 pg (ref 26.0–34.0)
MCHC: 33.6 g/dL (ref 30.0–36.0)
Platelets: 192 10*3/uL (ref 150–400)
RDW: 13.8 % (ref 11.5–15.5)

## 2013-06-09 MED ORDER — DILTIAZEM HCL ER COATED BEADS 120 MG PO CP24
120.0000 mg | ORAL_CAPSULE | Freq: Every day | ORAL | Status: DC
  Administered 2013-06-09 – 2013-06-11 (×3): 120 mg via ORAL
  Filled 2013-06-09 (×3): qty 1

## 2013-06-09 NOTE — Progress Notes (Signed)
Patient ID: Alison Singleton, female   DOB: 01/24/1917, 79 y.o.   MRN: [id number]   SUBJECTIVE:  Patient says she feels tired this morning.   Filed Vitals:   06/08/13 1500 06/08/13 2330 06/09/13 0456 06/09/13 1013  BP: 112/61  127/63 130/84  Pulse: 96 93 56   Temp: 97.8 F (36.6 C)  97.5 F (36.4 C)   TempSrc: Oral  Oral   Resp: 20  18   Height:      Weight:   130 lb 11.7 oz (59.3 kg)   SpO2: 98%  97%     Intake/Output Summary (Last 24 hours) at 06/09/13 1103 Last data filed at 06/09/13 0700  Gross per 24 hour  Intake    560 ml  Output    150 ml  Net    410 ml    LABS: Basic Metabolic Panel:  Recent Labs  78/29/56 1703 06/09/13 0510  NA 140 136  K 4.3 3.6  CL 103 103  CO2 26 20  GLUCOSE 82 79  BUN 15 16  CREATININE 0.73 0.69  CALCIUM 9.7 8.8  MG 2.2  --   PHOS 3.7  --    Liver Function Tests:  Recent Labs  06/07/13 1703  AST 16  ALT 8  ALKPHOS 53  BILITOT 0.6  PROT 7.2  ALBUMIN 3.6   No results found for this basename: LIPASE, AMYLASE,  in the last 72 hours CBC:  Recent Labs  06/07/13 1703 06/09/13 0510  WBC 11.4* 8.8  NEUTROABS 6.5  --   HGB 15.6* 13.7  HCT 44.7 40.8  MCV 93.3 93.8  PLT 240 192   Cardiac Enzymes:  Recent Labs  06/07/13 1703 06/07/13 2255 06/08/13 0550  TROPONINI <0.30 <0.30 <0.30   BNP: No components found with this basename: POCBNP,  D-Dimer: No results found for this basename: DDIMER,  in the last 72 hours Hemoglobin A1C: No results found for this basename: HGBA1C,  in the last 72 hours Fasting Lipid Panel: No results found for this basename: CHOL, HDL, LDLCALC, TRIG, CHOLHDL, LDLDIRECT,  in the last 72 hours Thyroid Function Tests:  Recent Labs  06/07/13 1703  TSH 4.151    RADIOLOGY: Ct Head Wo Contrast  06/07/2013   CLINICAL DATA:  79 year old female with decreased use of the left upper extremity.  EXAM: CT HEAD WITHOUT CONTRAST  TECHNIQUE: Contiguous axial images were obtained from the base of the  skull through the vertex without intravenous contrast.  COMPARISON:  Brain MRI 10/13/2007 and earlier.  FINDINGS: Visualized paranasal sinuses and mastoids are clear. No acute osseous abnormality identified. Visualized orbits and scalp soft tissues are within normal limits. Calcified atherosclerosis at the skull base.  Cerebral volume is within normal limits for age. No midline shift, mass effect, or evidence of intracranial mass lesion. No ventriculomegaly. No acute intracranial hemorrhage identified. Small hypodense focus near the caudate nucleus and genu of the left internal capsule is new since 2009 compatible with lacunar infarct. No evidence of cortically based acute infarction identified. No suspicious intracranial vascular hyperdensity.  IMPRESSION: Mild for age chronic small vessel disease. No acute intracranial abnormality.   Electronically Signed   By: Augusto Gamble M.D.   On: 06/07/2013 19:36    PHYSICAL EXAM she's lying comfortably in bed. There is no jugulovenous distention. Lungs reveal a few scattered rhonchi. There is no peripheral edema.   TELEMETRY: I reviewed telemetry today June 09, 2013. She remains in atrial fibrillation. In general her rate is  controlled. However she does have times when her rate is in the 30s.   ASSESSMENT AND PLAN:    Atrial fibrillation    The treatment of her current situation will be difficult. She has bradycardia tachycardia. She needs some medications to protect her from significant tachycardia for which he is symptomatic. So far and very low-dose diltiazem, she does have periods of significant bradycardia. She is also fatigued. We need to watch her rhythm for another day. Decision will have to be made tomorrow if it is appropriate to consider a pacemaker.   [name] 06/09/2013 11:03 AM

## 2013-06-09 NOTE — Progress Notes (Signed)
HR sustaining in the 40's and 50's since midnight.  Per central telemetry text page, HR of 30's frequently noted in the monitor.  Patient asymptomatic, no chest pains, denies dizziness and shortness of breath.  Will continue to evaluate pt.

## 2013-06-09 NOTE — Progress Notes (Signed)
Subjective: Uneventful 24 hours.  HR still occasionally in the 30's but range is lower on top end.  Asymptomatic.  COmpleted Echo (EF 50% with RA dilatation, otherwise as expected with age) and carotids within normal range as well.  Objective: Vital signs in last 24 hours: Temp:  [97.5 F (36.4 C)-97.8 F (36.6 C)] 97.5 F (36.4 C) (09/14 0456) Pulse Rate:  [56-96] 56 (09/14 0456) Resp:  [18-20] 18 (09/14 0456) BP: (105-127)/(61-67) 127/63 mmHg (09/14 0456) SpO2:  [97 %-98 %] 97 % (09/14 0456) Weight:  [59.3 kg (130 lb 11.7 oz)] 59.3 kg (130 lb 11.7 oz) (09/14 0456) Weight change: -1.029 kg (-2 lb 4.3 oz) Last BM Date: 06/07/13  Intake/Output from previous day: 09/13 0701 - 09/14 0700 In: 680 [P.O.:680] Out: 150 [Urine:150] Intake/Output this shift:   General appearance: alert, cooperative and appears stated age  Throat: lips, mucosa, and tongue normal; teeth and gums normal  Neck: no adenopathy, no carotid bruit, no JVD, supple, symmetrical, trachea midline and thyroid not enlarged, symmetric, no tenderness/mass/nodules  Resp: clear to auscultation bilaterally  Cardio: irregularly irregular rhythm  Extremities: extremities normal, atraumatic, no cyanosis or edema  Pulses: 2+ and symmetric  Skin: Skin color, texture, turgor normal. No rashes or lesions  Lab Results:  Recent Labs  06/07/13 1703 06/09/13 0510  WBC 11.4* 8.8  HGB 15.6* 13.7  HCT 44.7 40.8  PLT 240 192   BMET  Recent Labs  06/07/13 1703 06/09/13 0510  NA 140 136  K 4.3 3.6  CL 103 103  CO2 26 20  GLUCOSE 82 79  BUN 15 16  CREATININE 0.73 0.69  CALCIUM 9.7 8.8    Studies/Results: Ct Head Wo Contrast  06/07/2013   CLINICAL DATA:  79 year old female with decreased use of the left upper extremity.  EXAM: CT HEAD WITHOUT CONTRAST  TECHNIQUE: Contiguous axial images were obtained from the base of the skull through the vertex without intravenous contrast.  COMPARISON:  Brain MRI 10/13/2007 and  earlier.  FINDINGS: Visualized paranasal sinuses and mastoids are clear. No acute osseous abnormality identified. Visualized orbits and scalp soft tissues are within normal limits. Calcified atherosclerosis at the skull base.  Cerebral volume is within normal limits for age. No midline shift, mass effect, or evidence of intracranial mass lesion. No ventriculomegaly. No acute intracranial hemorrhage identified. Small hypodense focus near the caudate nucleus and genu of the left internal capsule is new since 2009 compatible with lacunar infarct. No evidence of cortically based acute infarction identified. No suspicious intracranial vascular hyperdensity.  IMPRESSION: Mild for age chronic small vessel disease. No acute intracranial abnormality.   Electronically Signed   By: Augusto Gamble M.D.   On: 06/07/2013 19:36    Medications:  I have reviewed the patient's current medications. Scheduled: . aspirin EC  325 mg Oral Daily  . diltiazem  120 mg Oral Daily  . enoxaparin (LOVENOX) injection  40 mg Subcutaneous Q24H  . levothyroxine  75 mcg Oral QAC breakfast   Continuous: . sodium chloride     ZOX:[id number], acetaminophen, senna-docusate, zolpidem  Assessment/Plan: Problem # 1: PAROXYSMAL ATRIAL FIBRILLATION (ICD-427.31) (ICD10-I48.0)  On Cardizem 30mg  q6h currently, Echo EF 50% with some RA dilatation, otherwise unremarkable.Marland KitchenHopefully the bradycardia will be less with CD cardizem as I have changed her to this AM (120mg , as noted per Cards recs yesterday). ASA only due top falls/bleed risk. [facility] Cards following.  Problem # 2: ABDOMINAL AORTIC ANEURYSM (ICD-441.4) (ICD10-I71.4)  Atherosclerosis.  Problem # 3: ANXIETY (  ICD-300.00) (ICD10-F41.9)  Doing well this AM, no new issues  Problem # 4: ABNORMALITY OF GAIT (ICD-781.2) (ICD10-R26.9)  CT head showed aging without acute stroke. Carotids ok as well. PT recommends continued care. Problem # 6: HYPOTHYROIDISM (ICD-244.9) (ICD10-E03.9)  TSH 4.1,  appropriate.  Problem # 7: HYPERTENSION (ICD-401.9) (ICD10-I10)  Controlled.   LOS: 2 days   Evangelyn Crouse W 06/09/2013, 8:34 AM

## 2013-06-10 ENCOUNTER — Inpatient Hospital Stay (HOSPITAL_COMMUNITY): Payer: Medicare Other

## 2013-06-10 MED ORDER — DILTIAZEM HCL ER COATED BEADS 120 MG PO CP24: 120.0000 mg | ORAL_CAPSULE | Freq: Every day | ORAL | Status: DC

## 2013-06-10 MED ORDER — ASPIRIN 325 MG PO TBEC: 325.0000 mg | DELAYED_RELEASE_TABLET | Freq: Every day | ORAL | Status: DC

## 2013-06-10 NOTE — Progress Notes (Signed)
Physical Therapy Treatment Patient Details Name: CHELSIA SERRES MRN: [id number] DOB: [birth year] Today's Date: 06/10/2013 Time: 4098-1191 PT Time Calculation (min): 24 min  PT Assessment / Plan / Recommendation  History of Present Illness HPI: 79 year old female for evaluation of atrial fibrillation. Echocardiogram in March of 2003 showed normal LV function. Nuclear study also negative at that time. Patient resides in independent living. Over the past 2 weeks she has noticed increased dyspnea on exertion. No orthopnea, PND or pedal edema. Question vague chest pain earlier today. Occasional palpitations x2 weeks as well. Also with dizziness. No syncope. Seen at her primary care physician's office and noted to be in atrial fibrillation and admitted. Cardiology asked to evaluate. She has apparently been felt not to be a Coumadin candidate previously because of potential eye bleeding and previous falls.   PT Comments   Patient continues to demonstrate deficits in functional mobility as indicated. Concerned for increase risk of falls based on current mobility level. Spoke with daughter at length regarding concerns and recommendation for ST SNF upon discharge, daughter in agreement that ST SNF is appropriate at this time. Will continue to see and progress activity as indicated.  Follow Up Recommendations  SNF           Equipment Recommendations  None recommended by PT       Frequency Min 3X/week   Progress towards PT Goals Progress towards PT goals: Progressing toward goals  Plan Current plan remains appropriate    Precautions / Restrictions Precautions Precautions: Fall Restrictions Weight Bearing Restrictions: No   Pertinent Vitals/Pain Patient reports some pain in left arm from previous fall (6 yrs ago)    Mobility  Bed Mobility Bed Mobility: Supine to Sit;Sitting - Scoot to Edge of Bed;Sit to Supine Supine to Sit: 5: Supervision Sitting - Scoot to Edge of Bed: 5: Supervision Sit to  Supine: 5: Supervision Details for Bed Mobility Assistance: No assist needed Transfers Transfers: Sit to Stand;Stand to Sit Sit to Stand: 5: Supervision Stand to Sit: 5: Supervision Details for Transfer Assistance: VCs for hand placement and safety with use of assistive device Ambulation/Gait Ambulation/Gait Assistance: 3: Mod assist Ambulation Distance (Feet): 340 Feet Assistive device: Rolling walker Ambulation/Gait Assistance Details: At times min guard, but still with occassional need for increased assist min to mod at time for noted loss of balance Gait Pattern: Step-through pattern;Trunk flexed;Narrow base of support Gait velocity: impulsive (fast) General Gait Details: Unsteady and unsafe at times with ambulation      PT Goals (current goals can now be found in the care plan section) Acute Rehab PT Goals Patient Stated Goal: to go back to [facility] PT Goal Formulation: With patient Time For Goal Achievement: 06/22/13 Potential to Achieve Goals: Good  Visit Information  Last PT Received On: 06/10/13 Assistance Needed: +1 History of Present Illness: HPI: 79 year old female for evaluation of atrial fibrillation. Echocardiogram in March of 2003 showed normal LV function. Nuclear study also negative at that time. Patient resides in independent living. Over the past 2 weeks she has noticed increased dyspnea on exertion. No orthopnea, PND or pedal edema. Question vague chest pain earlier today. Occasional palpitations x2 weeks as well. Also with dizziness. No syncope. Seen at her primary care physician's office and noted to be in atrial fibrillation and admitted. Cardiology asked to evaluate. She has apparently been felt not to be a Coumadin candidate previously because of potential eye bleeding and previous falls.    Subjective Data  Patient Stated Goal:  to go back to [facility]   Cognition  Cognition Arousal/Alertness: Awake/alert Behavior During Therapy: WFL for tasks  assessed/performed Overall Cognitive Status: Impaired/Different from baseline Area of Impairment: Safety/judgement;Awareness;Problem solving Memory: Decreased short-term memory Safety/Judgement: Decreased awareness of safety;Decreased awareness of deficits Awareness: Emergent Problem Solving: Slow processing;Difficulty sequencing;Requires verbal cues;Requires tactile cues General Comments: Patient very impulsive, unable to follow cues     Balance  Balance Balance Assessed: Yes Dynamic Standing Balance Dynamic Standing - Balance Support: Left upper extremity supported;No upper extremity supported;During functional activity Dynamic Standing - Level of Assistance: 4: Min assist High Level Balance High Level Balance Activites: Side stepping;Backward walking;Direction changes;Turns High Level Balance Comments: difficulty with turns, requires increased assist to prevent fall (scissoring on turns causing feet to run into each other)  End of Session PT - End of Session Equipment Utilized During Treatment: Gait belt Activity Tolerance: Patient tolerated treatment well Patient left: in bed;with call bell/phone within reach;with bed alarm set Nurse Communication: Mobility status   GP     Fabio Asa 06/10/2013, 11:31 AM Charlotte Crumb, PT DPT  [phone]

## 2013-06-10 NOTE — Progress Notes (Signed)
CSW spoke with [facility] who stated they are able to provide a bed when patient is medically ready for dc. CSW will update daughter.   Maree Krabbe, MSW, Theresia Majors [phone]

## 2013-06-10 NOTE — Progress Notes (Signed)
Patient ID: Alison Singleton, female   DOB: 1917/01/08, 79 y.o.   MRN: [id number]   SUBJECTIVE:  Patient says she feels tired this morning. Echo shows: Study Conclusions  - Left ventricle: Hypokinesis of apical septal segment. The cavity size was normal. Wall thickness was normal. The estimated ejection fraction was 50%. - Aortic valve: Sclerosis without stenosis. - Mitral valve: Mildly calcified annulus. Mild regurgitation. - Left atrium: The atrium was moderately to severely dilated. - Right ventricle: Systolic function was mildly reduced. - Right atrium: The atrium was moderately to severely dilated.   Filed Vitals:   06/09/13 1403 06/09/13 2036 06/10/13 0408 06/10/13 0420  BP: 103/70 136/59  130/81  Pulse: 97 103    Temp: 98.1 F (36.7 C) 98 F (36.7 C)  97.3 F (36.3 C)  TempSrc: Oral Oral Oral Oral  Resp: 16 18 18 18   Height:      Weight:      SpO2: 97% 94%  96%    Intake/Output Summary (Last 24 hours) at 06/10/13 0824 Last data filed at 06/10/13 0700  Gross per 24 hour  Intake    240 ml  Output      0 ml  Net    240 ml    LABS: Basic Metabolic Panel:  Recent Labs  07/22/24 1703 06/09/13 0510  NA 140 136  K 4.3 3.6  CL 103 103  CO2 26 20  GLUCOSE 82 79  BUN 15 16  CREATININE 0.73 0.69  CALCIUM 9.7 8.8  MG 2.2  --   PHOS 3.7  --    Liver Function Tests:  Recent Labs  06/07/13 1703  AST 16  ALT 8  ALKPHOS 53  BILITOT 0.6  PROT 7.2  ALBUMIN 3.6   No results found for this basename: LIPASE, AMYLASE,  in the last 72 hours CBC:  Recent Labs  06/07/13 1703 06/09/13 0510  WBC 11.4* 8.8  NEUTROABS 6.5  --   HGB 15.6* 13.7  HCT 44.7 40.8  MCV 93.3 93.8  PLT 240 192   Cardiac Enzymes:  Recent Labs  06/07/13 1703 06/07/13 2255 06/08/13 0550  TROPONINI <0.30 <0.30 <0.30   BNP: No components found with this basename: POCBNP,  D-Dimer: No results found for this basename: DDIMER,  in the last 72 hours Hemoglobin A1C: No results  found for this basename: HGBA1C,  in the last 72 hours Fasting Lipid Panel: No results found for this basename: CHOL, HDL, LDLCALC, TRIG, CHOLHDL, LDLDIRECT,  in the last 72 hours Thyroid Function Tests:  Recent Labs  06/07/13 1703  TSH 4.151    RADIOLOGY: Ct Head Wo Contrast  06/07/2013   CLINICAL DATA:  79 year old female with decreased use of the left upper extremity.  EXAM: CT HEAD WITHOUT CONTRAST  TECHNIQUE: Contiguous axial images were obtained from the base of the skull through the vertex without intravenous contrast.  COMPARISON:  Brain MRI 10/13/2007 and earlier.  FINDINGS: Visualized paranasal sinuses and mastoids are clear. No acute osseous abnormality identified. Visualized orbits and scalp soft tissues are within normal limits. Calcified atherosclerosis at the skull base.  Cerebral volume is within normal limits for age. No midline shift, mass effect, or evidence of intracranial mass lesion. No ventriculomegaly. No acute intracranial hemorrhage identified. Small hypodense focus near the caudate nucleus and genu of the left internal capsule is new since 2009 compatible with lacunar infarct. No evidence of cortically based acute infarction identified. No suspicious intracranial vascular hyperdensity.  IMPRESSION: Mild for age  chronic small vessel disease. No acute intracranial abnormality.   Electronically Signed   By: Augusto Gamble M.D.   On: 06/07/2013 19:36    PHYSICAL EXAM she's lying comfortably in bed. There is no jugulovenous distention. Lungs :  Clear Cor:  Irreg. Irreg. Abd: benign Ext:  No edema   TELEMETRY: A-Fib  90s   ASSESSMENT AND PLAN:    Atrial fibrillation She  Appears to be stable.  Her HR is in the normal range for most of the time.  I did not see any significant episodes of bradycardia on the tele review.  She is asymptomatic - no syncope.  I would continue with low dose of cardizem and not consider pacer at this time.  2. Dyspnea:  Multifactorial.  Age,  chronic A-fib.  O2 sats remain in the upper 90s.    Alison Singleton. 06/10/2013 8:24 AM

## 2013-06-10 NOTE — Evaluation (Signed)
Occupational Therapy Evaluation Patient Details Name: Alison Singleton MRN: [id number] DOB: [birth year] Today's Date: 06/10/2013 Time: 7829-5621 OT Time Calculation (min): 20 min  OT Assessment / Plan / Recommendation History of present illness HPI: 79 year old female for evaluation of atrial fibrillation. Echocardiogram in March of 2003 showed normal LV function. Nuclear study also negative at that time. Patient resides in independent living. Over the past 2 weeks she has noticed increased dyspnea on exertion. No orthopnea, PND or pedal edema. Question vague chest pain earlier today. Occasional palpitations x2 weeks as well. Also with dizziness. No syncope. Seen at her primary care physician's office and noted to be in atrial fibrillation and admitted. Cardiology asked to evaluate. She has apparently been felt not to be a Coumadin candidate previously because of potential eye bleeding and previous falls.   Clinical Impression   Pt demos decline in function and safety with ADLs and ADL mobility safety and would benefit from acute OT services to address impairments to increase level of function and safety    OT Assessment  Patient needs continued OT Services    Follow Up Recommendations  SNF;Supervision/Assistance - 24 hour (Home health OT if pt will have adequate sup/support at ILF)   Barriers to Discharge Decreased caregiver support Pt resideas at an ILF and level of assist that can be provided may be non existant  Equipment Recommendations  None recommended by OT;Other (comment) (TBD)    Recommendations for Other Services    Frequency  Min 2X/week    Precautions / Restrictions Precautions Precautions: Fall Restrictions Weight Bearing Restrictions: No   Pertinent Vitals/Pain 5/10 L shoulder. Pt states that she hurt shoulder from chasing rabbits away from her garden a few years ago and it was aggravated with her most recent fall    ADL  Grooming: Performed;Wash/dry hands;Wash/dry  face;Min guard Where Assessed - Grooming: Supported standing Upper Body Bathing: Simulated;Supervision/safety;Set up Where Assessed - Upper Body Bathing: Unsupported sitting Lower Body Bathing: Min guard;Minimal assistance;Simulated Upper Body Dressing: Performed;Supervision/safety;Set up Where Assessed - Upper Body Dressing: Unsupported sitting Lower Body Dressing: Performed;Minimal assistance;Min guard Where Assessed - Lower Body Dressing: Supported sit to stand Toilet Transfer: Performed;Min guard Statistician Method: Sit to Barista: Regular height toilet;Grab bars Toileting - Architect and Hygiene: Performed;Min guard Where Assessed - Engineer, mining and Hygiene: Standing Tub/Shower Transfer Method: Not assessed Transfers/Ambulation Related to ADLs: cues for safety, correct hand placement ADL Comments: LB assist required due to decreased balance and safety. Per pt' s dtr, pt is at ILF    OT Diagnosis: Generalized weakness  OT Problem List: Decreased strength;Decreased knowledge of use of DME or AE;Impaired balance (sitting and/or standing);Decreased safety awareness;Pain;Decreased activity tolerance;Decreased knowledge of precautions OT Treatment Interventions: Self-care/ADL training;Therapeutic exercise;Patient/family education;Neuromuscular education;Balance training;Therapeutic activities;DME and/or AE instruction   OT Goals(Current goals can be found in the care plan section) Acute Rehab OT Goals Patient Stated Goal: to go back to [facility] OT Goal Formulation: With patient/family Time For Goal Achievement: 06/17/13 Potential to Achieve Goals: Good ADL Goals Pt Will Perform Grooming: with set-up;with modified independence;standing Pt Will Perform Lower Body Bathing: with min guard assist;with supervision;with set-up;sit to/from stand Pt Will Perform Lower Body Dressing: with min guard assist;with supervision;with  set-up;sit to/from stand Pt Will Transfer to Toilet: with supervision;with modified independence;grab bars;ambulating;regular height toilet;bedside commode Pt Will Perform Toileting - Clothing Manipulation and hygiene: with supervision;with modified independence  Visit Information  Last OT Received On: 06/10/13 Assistance Needed: +1 History  of Present Illness: HPI: 79 year old female for evaluation of atrial fibrillation. Echocardiogram in March of 2003 showed normal LV function. Nuclear study also negative at that time. Patient resides in independent living. Over the past 2 weeks she has noticed increased dyspnea on exertion. No orthopnea, PND or pedal edema. Question vague chest pain earlier today. Occasional palpitations x2 weeks as well. Also with dizziness. No syncope. Seen at her primary care physician's office and noted to be in atrial fibrillation and admitted. Cardiology asked to evaluate. She has apparently been felt not to be a Coumadin candidate previously because of potential eye bleeding and previous falls.       Prior Functioning     Home Living Family/patient expects to be discharged to:: Other (Comment) (independent living at [facility]) Home Equipment: Dan Humphreys - 4 wheels Prior Function Level of Independence: Independent with assistive device(s) Communication Communication: HOH Dominant Hand: Right         Vision/Perception Vision - History Baseline Vision: Wears glasses all the time Patient Visual Report: No change from baseline Perception Perception: Within Functional Limits   Cognition  Cognition Arousal/Alertness: Awake/alert Behavior During Therapy: WFL for tasks assessed/performed Memory: Decreased short-term memory;Decreased recall of precautions    Extremity/Trunk Assessment Upper Extremity Assessment Upper Extremity Assessment: Overall WFL for tasks assessed;Generalized weakness     Mobility Bed Mobility Bed Mobility: Supine to Sit;Sitting - Scoot  to Edge of Bed;Sit to Supine Supine to Sit: 5: Supervision Sitting - Scoot to Edge of Bed: 5: Supervision Sit to Supine: 5: Supervision Transfers Transfers: Sit to Stand;Stand to Sit Sit to Stand: 4: Min guard;5: Supervision;From bed;From toilet Stand to Sit: 5: Supervision;To bed;To toilet Details for Transfer Assistance: VCs for hand placement and safety with use of assistive device, pt impulsive     Exercise     Balance Balance Balance Assessed: Yes Dynamic Standing Balance Dynamic Standing - Balance Support: Left upper extremity supported;No upper extremity supported;During functional activity Dynamic Standing - Level of Assistance: 4: Min assist   End of Session OT - End of Session Equipment Utilized During Treatment: Gait belt;Other (comment) (cane) Activity Tolerance: Patient tolerated treatment well;Patient limited by fatigue Patient left: in bed;with call bell/phone within reach;with family/visitor present  GO     Galen Manila 06/10/2013, 11:01 AM

## 2013-06-10 NOTE — Progress Notes (Signed)
CSW provided list of bed offers to patient and daughter at bedside. Daughter stated she prefers [facility] or [facility]. CSW will follow up with facilities to confirm a bed for dc when patient is medically ready. FL2 on chart for MD to sign.  Maree Krabbe, MSW, [facility] [phone]

## 2013-06-10 NOTE — Progress Notes (Signed)
Clinical Social Work Department BRIEF PSYCHOSOCIAL ASSESSMENT 06/10/2013  Patient:  Alison Singleton, Alison Singleton     Account Number:  [id number]     Admit date:  06/07/2013  Clinical Social Worker:  Carren Rang  Date/Time:  06/10/2013 01:29 PM  Referred by:  Care Management  Date Referred:  06/10/2013 Referred for  SNF Placement   Other Referral:   Interview type:  Family Other interview type:    PSYCHOSOCIAL DATA Living Status:  FACILITY Admitted from facility:  [facility] Level of care:  Independent Living Primary support name:  Audrie Gallus Primary support relationship to patient:  CHILD, ADULT Degree of support available:   Good    CURRENT CONCERNS Current Concerns  Post-Acute Placement   Other Concerns:    SOCIAL WORK ASSESSMENT / PLAN CSW received referral for SNF placement at dc. Patient is from [facility] but PT is recommending SNF-. CSW went into room and introduced self. Daughter agreeable to be faxed out (per RN this morning to [facility]). CSW presented bed offers to daughter. Daughter stated that she wants [facility] or [facility]. CSW will follow up with facilities   Assessment/plan status:  Psychosocial Support/Ongoing Assessment of Needs Other assessment/ plan:   Information/referral to community resources:   SNF information    PATIENT'S/FAMILY'S RESPONSE TO PLAN OF CARE: Patient and daughter agreeable to SNF plan.       Maree Krabbe, MSW, [facility] [phone]

## 2013-06-10 NOTE — Progress Notes (Signed)
Patient refused to walk again. "I'm too cold to walk." Offered extra gown and blanket. Patient just wants to sit in bedside chair at this time. Chair alarm in place. Will continue to monitor.Alison Singleton

## 2013-06-10 NOTE — Care Management Note (Signed)
    Page 1 of 1   06/11/2013     4:20:10 PM   CARE MANAGEMENT NOTE 06/11/2013  Patient:  Alison Singleton, Alison Singleton   Account Number:  [id number]  Date Initiated:  06/10/2013  Documentation initiated by:  Daija Routson  Subjective/Objective Assessment:   PT ADM ON 06/07/13 WITH AFIB WITH RVR.  PTA, PT LIVES ALONE AT [facility].  SHE HAS SUPPORTIVE DAUGHTER.     Action/Plan:   P.T./O.T. EVAL RECOMMENDING SNF AT DC.; FIRST CHOICE IS [facility].  CSW CONSULTED TO FACILITATE DC TO SNF WHEN MEDICALLY STABLE FOR DC.   Anticipated DC Date:  06/11/2013   Anticipated DC Plan:  SKILLED NURSING FACILITY  In-house referral  Clinical Social Worker      DC Planning Services  CM consult      Choice offered to / List presented to:             Status of service:  Completed, signed off Medicare Important Message given?   (If response is "NO", the following Medicare IM given date fields will be blank) Date Medicare IM given:   Date Additional Medicare IM given:    Discharge Disposition:  SKILLED NURSING FACILITY  Per UR Regulation:  Reviewed for med. necessity/level of care/duration of stay  If discussed at Long Length of Stay Meetings, dates discussed:    Comments:  06/11/13 Kendelle Schweers,RN,BSN [phone] PT DISCHARGED TO [facility] TODAY, PER CSW ARRANGEMENTS.

## 2013-06-10 NOTE — Progress Notes (Signed)
Patient continues to ambulate in room impulsively. Patient is irritated by chair alarm. Patient is unsteady on her feet w/out walker,therefore, she has been told several times not to get up w/out RN for safety reasons. Patient is not confused but is very independent in her ways. Will continue to monitor.Alison Singleton

## 2013-06-10 NOTE — Progress Notes (Signed)
Subjective: Feels weak and tired. Very frustrated with the bed alarm. B Hamstring pains for 1 month   Objective: Vital signs in last 24 hours: Temp:  [97.3 F (36.3 C)-98.1 F (36.7 C)] 97.3 F (36.3 C) (09/15 0420) Pulse Rate:  [97-103] 103 (09/14 2036) Resp:  [16-18] 18 (09/15 0420) BP: (103-136)/(59-84) 130/81 mmHg (09/15 0420) SpO2:  [94 %-97 %] 96 % (09/15 0420) Weight change:  Last BM Date: 06/07/13  Intake/Output from previous day: 09/14 0701 - 09/15 0700 In: 120 [P.O.:120] Out: -  Intake/Output this shift:   General appearance: alert, cooperative and appears stated age. Very HOH Neck: no carotid bruit, no JVD Resp: clear to auscultation bilaterally  Cardio: irregularly irregular rhythm  Extremities: extremities normal, atraumatic, no cyanosis or edema  Pulses: 2+ and symmetric  Skin: Skin color, texture, turgor normal. No rashes or lesions Tender B Hamstrings?  Lab Results:  Recent Labs  06/07/13 1703 06/09/13 0510  WBC 11.4* 8.8  HGB 15.6* 13.7  HCT 44.7 40.8  PLT 240 192   BMET  Recent Labs  06/07/13 1703 06/09/13 0510  NA 140 136  K 4.3 3.6  CL 103 103  CO2 26 20  GLUCOSE 82 79  BUN 15 16  CREATININE 0.73 0.69  CALCIUM 9.7 8.8    Studies/Results: No results found.  Medications:  I have reviewed the patient's current medications. Scheduled: . aspirin EC  325 mg Oral Daily  . diltiazem  120 mg Oral Daily  . enoxaparin (LOVENOX) injection  40 mg Subcutaneous Q24H  . levothyroxine  75 mcg Oral QAC breakfast   Continuous: . sodium chloride     ZOX:[id number], acetaminophen, senna-docusate, zolpidem  Assessment/Plan: Problem # 1:  ATRIAL FIBRILLATION. Admitted from my office Sxatic c RVR, Presyncope/Dizzy and feeling poor. On Cardizem CD 120, being evaluated for Tachy Huston Foley and for possible Pacer needs. HR as low as 38. Some pauses. Seems to be tolerating??  Echo: - Left ventricle: Hypokinesis of apical septal segment. The  cavity size was normal. Wall thickness was normal. The estimated ejection fraction was 50%. - Aortic valve: Sclerosis without stenosis. - Mitral valve: Mildly calcified annulus. Mild regurgitation. - Left atrium: The atrium was moderately to severely dilated. - Right ventricle: Systolic function was mildly reduced. - Right atrium: The atrium was moderately to severely dilated.  She had a short episode on the day of admit of inability to use L arm.  ? CVA.  CCT= Mild for age chronic small vessel disease. No acute intracranial abnormality.  MRI would have been a better test but felt unnecessary.  She was evaluated by Cards/Medicine and felt to be a poor coumadin candidate secondary to age/fear of falls/and eye issues.  She was changed from plavix to full dose ASA.  She was also w/up for the ?CVA and known h/o carotid stenosis c a carotid doppler and it was unremarkable: Bilateral: mild mixed plaque origin and proximal ICA with acoustic shadowing. 1-39% ICA stenosis. Vertebral artery flow is antegrade. ICA/CCA ratio: R-1.13 L-1.33  Labs showed thyroid parameters fine and she ruled out for MI. Pro-BNP was 543.8??  No Sxs of CHF  Hopefully the bradycardia will be less on CD cardizem and she can go home??   Problem # 2: ABDOMINAL AORTIC ANEURYSM (ICD-441.4) (ICD10-I71.4)  Atherosclerosis only on last Ab [place] 01/2010 - No further w/up needed.  Problem # 3: ANXIETY (ICD-300.00) (ICD10-F41.9)  Doing well this AM, no new issues   Problem # 4: ABNORMALITY OF  GAIT (ICD-781.2) (ICD10-R26.9)  Walker. Home c PT vrs SNF for Rehab?  Problem # 6: HYPOTHYROIDISM (ICD-244.9) (ICD10-E03.9)  TSH 4.1, appropriate.   Problem # 7: HYPERTENSION (ICD-401.9) (ICD10-I10)  Controlled.  Problem #8 - DNR/DVT Proph supplied.  Problem #9 - Dispo - Will let Cards see pt and decide on final med list and whether we need to consider pacer? Will also have CW see pt and make some recs. Maybe D/C later today or  tomorrow  Problem 10 - Hamstring pain. Check Lumbar x ray - R/Out compression Fx with her known Osteoporosis.   LOS: 3 days   Alison Singleton 06/10/2013, 7:08 AM

## 2013-06-10 NOTE — Progress Notes (Signed)
Clinical Social Work Department CLINICAL SOCIAL WORK PLACEMENT NOTE 06/10/2013  Patient:  TOMARA, YOUNGBERG  Account Number:  [id number] Admit date:  06/07/2013  Clinical Social Worker:  Carren Rang  Date/time:  06/10/2013 01:32 PM  Clinical Social Work is seeking post-discharge placement for this patient at the following level of care:   SKILLED NURSING   (*CSW will update this form in Epic as items are completed)   06/10/2013  Patient/family provided with [facility] Department of Clinical Social Work's list of facilities offering this level of care within the geographic area requested by the patient (or if unable, by the patient's family).  06/10/2013  Patient/family informed of their freedom to choose among providers that offer the needed level of care, that participate in Medicare, Medicaid or managed care program needed by the patient, have an available bed and are willing to accept the patient.  06/10/2013  Patient/family informed of [facility]' ownership interest in [facility], as well as of the fact that they are under no obligation to receive care at this facility.  PASARR submitted to EDS on 06/09/2013 PASARR number received from EDS on 06/09/2013  FL2 transmitted to all facilities in geographic area requested by pt/family on  06/10/2013 FL2 transmitted to all facilities within larger geographic area on   Patient informed that his/her managed care company has contracts with or will negotiate with  certain facilities, including the following:     Patient/family informed of bed offers received:  06/10/2013 Patient chooses bed at  Physician recommends and patient chooses bed at    Patient to be transferred to  on   Patient to be transferred to facility by   The following physician request were entered in Epic:   Additional Comments:  Maree Krabbe, MSW, [place] [phone]

## 2013-06-11 MED ORDER — BISACODYL 10 MG RE SUPP
10.0000 mg | Freq: Once | RECTAL | Status: AC
  Administered 2013-06-11: 10 mg via RECTAL
  Filled 2013-06-11: qty 1

## 2013-06-11 NOTE — Discharge Summary (Signed)
Physician Discharge Summary  DISCHARGE SUMMARY   Patient ID: Alison Singleton MR#: [id number] DOB/AGE: 1917-01-05 79 y.o.   Attending Physician:Warwick Nick M  Patient's WUJ:WJXBJ,YNWG M, MD  Consults:Treatment Team:  Rounding Lbcardiology, MD**  Admit date: 06/07/2013 Discharge date: 06/11/2013  Discharge Diagnoses:  Active Problems:   Atrial fibrillation   Patient Active Problem List   Diagnosis Date Noted  . Atrial fibrillation 06/07/2013   Past Medical History  Diagnosis Date  . Hypertension   . CVA (cerebral infarction)   . Hyperlipidemia   . Hypothyroid   . Diabetes mellitus   . COPD (chronic obstructive pulmonary disease)   . Osteoporosis   . Cataracts, bilateral     Discharged Condition: good   Discharge Medications:   Medication List    STOP taking these medications       amLODipine 2.5 MG tablet  Commonly known as:  NORVASC     clopidogrel 75 MG tablet  Commonly known as:  PLAVIX     digoxin 0.125 MG tablet  Commonly known as:  LANOXIN      TAKE these medications       alendronate 70 MG tablet  Commonly known as:  FOSAMAX  Take 70 mg by mouth every 7 (seven) days. Take with a full glass of water on an empty stomach.     aspirin 325 MG EC tablet  Take 1 tablet (325 mg total) by mouth daily.     diltiazem 120 MG 24 hr capsule  Commonly known as:  CARDIZEM CD  Take 1 capsule (120 mg total) by mouth daily.     levothyroxine 75 MCG tablet  Commonly known as:  SYNTHROID, LEVOTHROID  Take 75 mcg by mouth daily before breakfast.        Hospital Procedures: Dg Lumbar Spine 2-3 Views  06/10/2013   *RADIOLOGY REPORT*  Clinical Data: Low back pain.  LUMBAR SPINE - 2-3 VIEW  Comparison: Plain films lumbar spine 04/30/2008.  Findings: Again seen is severe convex right scoliosis and multilevel degenerative disease.  No fracture is identified. Possible left renal stone seen on the prior examination is no longer appreciated.  Extensive atherosclerotic  vascular disease of the aorta is again seen.  The aorta appears dilated up to 4.0 cm.  IMPRESSION:  1.  No acute finding. 2.  Severe scoliosis and multilevel degenerative change. 3.  4.0 cm abdominal aortic aneurysm, not notably changed. 4.  Possible left renal stone seen on the comparison study is no longer visualized.   Original Report Authenticated By: Holley Dexter, M.D.   Ct Head Wo Contrast  06/07/2013   CLINICAL DATA:  79 year old female with decreased use of the left upper extremity.  EXAM: CT HEAD WITHOUT CONTRAST  TECHNIQUE: Contiguous axial images were obtained from the base of the skull through the vertex without intravenous contrast.  COMPARISON:  Brain MRI 10/13/2007 and earlier.  FINDINGS: Visualized paranasal sinuses and mastoids are clear. No acute osseous abnormality identified. Visualized orbits and scalp soft tissues are within normal limits. Calcified atherosclerosis at the skull base.  Cerebral volume is within normal limits for age. No midline shift, mass effect, or evidence of intracranial mass lesion. No ventriculomegaly. No acute intracranial hemorrhage identified. Small hypodense focus near the caudate nucleus and genu of the left internal capsule is new since 2009 compatible with lacunar infarct. No evidence of cortically based acute infarction identified. No suspicious intracranial vascular hyperdensity.  IMPRESSION: Mild for age chronic small vessel disease. No acute intracranial abnormality.  Electronically Signed   By: Augusto Gamble M.D.   On: 06/07/2013 19:36    History of Present Illness: 16 F c H/O Osteoporosis, Hypothyroid, prior CVA on Plavix, HTN and PAF who presented to my office 9/12 with chest pain, Palpitations, L arm pain, brief period of inability to use L arm, presyncope, Dizzy, FTT and feeling poor. EKG confirmed AFIB RVR and she was admitted for eval and treatment.  Hospital Course: Problem # 1: ATRIAL FIBRILLATION. Admitted from my office Sxatic c RVR,  Presyncope/Dizzy and feeling poor. One dose of IV cardizem was given. Cardizem PO q 6 hrs and Dig were continued.  Dig level was 0.6.  Cards was consulted and wanted Dig d/ced so it was.  After rate was better cardizem q 6 was converted to CD 24 hr dosing. On Cardizem CD 120 currently and had been evaluated for Maggie Schwalbe and for possible Pacer needs. Over the last 24 hrs her HR has been higher and Dr Elease Hashimoto saw her on 06/10/13 and his note stated: She Appears to be stable. Her HR is in the normal range for most of the time. I did not see any significant episodes of bradycardia on the tele review. She is asymptomatic - no syncope. I would continue with low dose of cardizem and not consider pacer at this time.  And as for her Dyspnea: Multifactorial. Age, chronic A-fib. O2 sats remain in the upper 90s.  HR in last 24 hrs 60-110. Some pauses.  Tolerating better.  Previously saw Dr Elease Hashimoto 2003.  Prior ECHO 3/10/3 Nml EF. Mild LVH. EF 65-75%  3/4/3 (-) Stress cardiolyte.  Dr Cecilie Kicks took her off coumadin in 2003 b/c her eye issues and said she should never be on it.   Current Echo: - Left ventricle: Hypokinesis of apical septal segment. The cavity size was normal. Wall thickness was normal. The estimated ejection fraction was 50%. - Aortic valve: Sclerosis without stenosis. - Mitral valve: Mildly calcified annulus. Mild regurgitation. - Left atrium: The atrium was moderately to severely dilated. - Right ventricle: Systolic function was mildly reduced. - Right atrium: The atrium was moderately to severely dilated.  She had a short episode on the day of admit of inability to use L arm. ? CVA. CCT= Mild for age chronic small vessel disease. No acute intracranial abnormality. MRI would have been a better test but felt unnecessary.  She was evaluated by Cards/Medicine and felt to be a poor coumadin candidate secondary to age/fear of falls/and eye issues. She was changed from plavix to full dose ASA.   She was also w/up for the ?CVA and known h/o carotid stenosis c a carotid doppler and it was unremarkable: Bilateral: mild mixed plaque origin and proximal ICA with acoustic shadowing. 1-39% ICA stenosis. Vertebral artery flow is antegrade. ICA/CCA ratio: R-1.13 L-1.33  Labs showed thyroid parameters fine and she ruled out for MI. Pro-BNP was 543.8?? No Sxs of CHF   Problem # 2: ABDOMINAL AORTIC ANEURYSM (ICD-441.4) (ICD10-I71.4)  Atherosclerosis only on last Ab [place] 01/2010 - No further w/up needed. X Ray shows @ 4 cm AAA - again no further w/up needed. Problem # 3: ANXIETY (ICD-300.00) (ICD10-F41.9)  Doing well this AM, no new issues  Problem # 4: ABNORMALITY OF GAIT (ICD-781.2) (ICD10-R26.9)  Walker.  Seen by PT/OT and she will go to SNF for 1-2 weeks prior to returning home. Problem # 6: HYPOTHYROIDISM (ICD-244.9) (ICD10-E03.9)  TSH 4.1, appropriate.  Problem # 7: HYPERTENSION (ICD-401.9) (ICD10-I10)  Controlled.  Problem #8 - DNR.  OOF DNR signed.  DVT Proph supplied in hospital.  Problem #9 - No BM since 9/12 - Suppository today. Problem 10 - Hamstring pain.  Lumbar x ray showed:  No acute finding.  Severe scoliosis and multilevel degenerative change.  4.0 cm abdominal aortic aneurysm, not notably changed. Possible left renal stone seen on the comparison study is no longer visualized.  Seen on am rounds on 9/16 and she looks better.  No chest pain.  No Pre-Syncope.  Maybe a brief second of dizzy on rising but tolerable.  She is much better.  No BM since 9/12 and she will get a suppository prio to D/c.  She was seen by PT/OT/CW yesterday and it was decided that she would do better in SNF for her deconditioning prior to returning home.  At this point she needs Rehab, TLC, and medication management.  She is stable for D/C.    Day of Discharge Exam BP 118/80  Pulse 106  Temp(Src) 98 F (36.7 C) (Oral)  Resp 17  Ht 5\' 4"  (1.626 m)  Wt 59.3 kg (130 lb 11.7 oz)  BMI 22.43 kg/m2  SpO2  97%  Physical Exam: General appearance: alert, cooperative and appears stated age. Very HOH  Neck: no carotid bruit, no JVD  Resp: clear to auscultation bilaterally  Cardio: irregularly irregular rhythm  Extremities: extremities normal, atraumatic, no cyanosis or edema  Pulses: 2+ and symmetric  Skin: Skin color, texture, turgor normal. No rashes or lesions     Discharge Labs:  Recent Labs  06/09/13 0510  NA 136  K 3.6  CL 103  CO2 20  GLUCOSE 79  BUN 16  CREATININE 0.69  CALCIUM 8.8   No results found for this basename: AST, ALT, ALKPHOS, BILITOT, PROT, ALBUMIN,  in the last 72 hours  Recent Labs  06/09/13 0510  WBC 8.8  HGB 13.7  HCT 40.8  MCV 93.8  PLT 192   No results found for this basename: CKTOTAL, CKMB, CKMBINDEX, TROPONINI,  in the last 72 hours No results found for this basename: TSH, T4TOTAL, FREET3, T3FREE, THYROIDAB,  in the last 72 hours No results found for this basename: VITAMINB12, FOLATE, FERRITIN, TIBC, IRON, RETICCTPCT,  in the last 72 hours Lab Results  Component Value Date   INR 1.0 10/11/2007       Discharge instructions:       Follow-up Information   Follow up with Gwen Pounds, MD In 7 days.   Specialty:  Internal Medicine   Contact information:   [address] [facility] [place] [address] [phone]        Disposition: SNF Follow-up Appts: Follow-up with Dr. Timothy Lasso at [facility] 1 week post D/C from SNF.  Call for appointment.  Condition on Discharge: stable  Tests Needing Follow-up: Follow HR and Sxs.  Time spent in discharge (includes decision making & examination of pt):  35 min  Signed: Jocelyne Reinertsen M 06/11/2013, 7:47 AM

## 2013-06-11 NOTE — Progress Notes (Signed)
PT discharged per MD order and protocol. Pt transported to [facility] via [facility]. Pt's daughter made aware. Report called to [place] at [facility]. Discharge instructions sent with [facility].

## 2013-06-11 NOTE — Progress Notes (Signed)
Clinical Social Worker facilitated patient discharge by contacting the patient, family and facility, [facility]. Patient and daughter agreeable to this plan and arranging transport via EMS . CSW will sign off, as social work intervention is no longer needed.  Maree Krabbe, MSW, [facility] [phone]

## 2013-06-11 NOTE — Progress Notes (Signed)
Clinical Social Work Department CLINICAL SOCIAL WORK PLACEMENT NOTE 06/11/2013  Patient:  Alison Singleton, Alison Singleton  Account Number:  [id number] Admit date:  06/07/2013  Clinical Social Worker:  Carren Rang  Date/time:  06/10/2013 01:32 PM  Clinical Social Work is seeking post-discharge placement for this patient at the following level of care:   SKILLED NURSING   (*CSW will update this form in Epic as items are completed)   06/10/2013  Patient/family provided with [facility] Department of Clinical Social Work's list of facilities offering this level of care within the geographic area requested by the patient (or if unable, by the patient's family).  06/10/2013  Patient/family informed of their freedom to choose among providers that offer the needed level of care, that participate in Medicare, Medicaid or managed care program needed by the patient, have an available bed and are willing to accept the patient.  06/10/2013  Patient/family informed of [facility]' ownership interest in [facility], as well as of the fact that they are under no obligation to receive care at this facility.  PASARR submitted to EDS on 06/09/2013 PASARR number received from EDS on 06/09/2013  FL2 transmitted to all facilities in geographic area requested by pt/family on  06/10/2013 FL2 transmitted to all facilities within larger geographic area on   Patient informed that his/her managed care company has contracts with or will negotiate with  certain facilities, including the following:     Patient/family informed of bed offers received:  06/10/2013 Patient chooses bed at [facility] Physician recommends and patient chooses bed at    Patient to be transferred to [facility] on  06/11/2013 Patient to be transferred to facility by EMS  The following physician request were entered in Epic:   Additional Comments:  Maree Krabbe, MSW, [employer] [phone]

## 2013-06-12 ENCOUNTER — Non-Acute Institutional Stay (SKILLED_NURSING_FACILITY): Payer: Medicare Other | Admitting: Internal Medicine

## 2013-06-12 DIAGNOSIS — E1059 Type 1 diabetes mellitus with other circulatory complications: Secondary | ICD-10-CM

## 2013-06-12 DIAGNOSIS — J449 Chronic obstructive pulmonary disease, unspecified: Secondary | ICD-10-CM

## 2013-06-12 DIAGNOSIS — I4891 Unspecified atrial fibrillation: Secondary | ICD-10-CM

## 2013-06-12 DIAGNOSIS — E039 Hypothyroidism, unspecified: Secondary | ICD-10-CM

## 2013-07-04 ENCOUNTER — Non-Acute Institutional Stay (SKILLED_NURSING_FACILITY): Payer: Medicare Other | Admitting: Adult Health

## 2013-07-04 DIAGNOSIS — F32A Depression, unspecified: Secondary | ICD-10-CM

## 2013-07-04 DIAGNOSIS — M810 Age-related osteoporosis without current pathological fracture: Secondary | ICD-10-CM

## 2013-07-04 DIAGNOSIS — F3289 Other specified depressive episodes: Secondary | ICD-10-CM

## 2013-07-04 DIAGNOSIS — E119 Type 2 diabetes mellitus without complications: Secondary | ICD-10-CM

## 2013-07-04 DIAGNOSIS — F329 Major depressive disorder, single episode, unspecified: Secondary | ICD-10-CM

## 2013-07-04 DIAGNOSIS — I4891 Unspecified atrial fibrillation: Secondary | ICD-10-CM

## 2013-07-04 DIAGNOSIS — E039 Hypothyroidism, unspecified: Secondary | ICD-10-CM

## 2013-07-16 DIAGNOSIS — E039 Hypothyroidism, unspecified: Secondary | ICD-10-CM | POA: Insufficient documentation

## 2013-07-16 DIAGNOSIS — E1051 Type 1 diabetes mellitus with diabetic peripheral angiopathy without gangrene: Secondary | ICD-10-CM | POA: Insufficient documentation

## 2013-07-16 DIAGNOSIS — E1059 Type 1 diabetes mellitus with other circulatory complications: Secondary | ICD-10-CM | POA: Insufficient documentation

## 2013-07-16 DIAGNOSIS — J449 Chronic obstructive pulmonary disease, unspecified: Secondary | ICD-10-CM | POA: Insufficient documentation

## 2013-07-16 DIAGNOSIS — J4489 Other specified chronic obstructive pulmonary disease: Secondary | ICD-10-CM | POA: Insufficient documentation

## 2013-07-16 NOTE — Progress Notes (Signed)
Patient ID: Alison Singleton, female   DOB: 1917/02/12, 79 y.o.   MRN: [id number]        HISTORY & PHYSICAL  DATE: 06/12/2013   FACILITY: [facility]  LEVEL OF CARE: SNF (31)  ALLERGIES:  Allergies  Allergen Reactions  . Claritin [Loratadine]     CHIEF COMPLAINT:  Manage atrial fibrillation, diabetes mellitus, and hypothyroidism.    HISTORY OF PRESENT ILLNESS:  The patient is a 79 year-old, Caucasian female.     ATRIAL FIBRILLATION:  The patient was hospitalized secondary to chest pain, palpitations, left arm pain, presyncope, dizziness, and a poor feeling.  EKG confirmed AFib with RVR.  She was treated with IV Cardizem and transitioned to p.o. Cardizem.  2D-echo showed EF of 50% and hypokinesis of apical septal segment.  CT of the head did not show acute findings.  Carotid doppler was unremarkable.  She ruled out for MI.  the patients atrial fibrillation remains stable.  The patient denies DOE, tachycardia, orthopnea, transient neurological sx, pedal edema, palpitations, & PNDs.  No complications noted from the medications currently being used.    HYPOTHYROIDISM: The hypothyroidism remains stable. No complications noted from the medications presently being used.  The patient denies fatigue or constipation.  Last TSH:  4.1.    DM:pt's DM remains stable.  Pt denies polyuria, polydipsia, polyphagia, changes in vision or hypoglycemic episodes.  No complications noted from the medication presently being used.  Last hemoglobin A1c is:  5.7.    PAST MEDICAL HISTORY :  Past Medical History  Diagnosis Date  . Hypertension   . CVA (cerebral infarction)   . Hyperlipidemia   . Hypothyroid   . Diabetes mellitus   . COPD (chronic obstructive pulmonary disease)   . Osteoporosis   . Cataracts, bilateral     PAST SURGICAL HISTORY: Past Surgical History  Procedure Laterality Date  . Tonsillectomy      SOCIAL HISTORY:  reports that she has quit smoking. She does not have any  smokeless tobacco history on file. She reports that she does not drink alcohol.  FAMILY HISTORY:  Family History  Problem Relation Age of Onset  . Heart disease      No family history    CURRENT MEDICATIONS: Reviewed per [facility]  REVIEW OF SYSTEMS:  See HPI otherwise 14 point ROS is negative.  PHYSICAL EXAMINATION  VS:  T 98.7       P 100      RR 20      BP 124/89      POX 93% room air        WT (Lb)  GENERAL: no acute distress, normal body habitus EYES: conjunctivae normal, sclerae normal, normal eye lids MOUTH/THROAT: lips without lesions,no lesions in the mouth,tongue is without lesions,uvula elevates in midline NECK: supple, trachea midline, no neck masses, no thyroid tenderness, no thyromegaly LYMPHATICS: no LAN in the neck, no supraclavicular LAN RESPIRATORY: breathing is even & unlabored, BS CTAB CARDIAC: heart rate is irregular irregular, no murmur,no extra heart sounds, no edema GI:  ABDOMEN: abdomen soft, normal BS, no masses, no tenderness  LIVER/SPLEEN: no hepatomegaly, no splenomegaly MUSCULOSKELETAL: HEAD: normal to inspection & palpation BACK: no kyphosis, scoliosis or spinal processes tenderness EXTREMITIES: LEFT UPPER EXTREMITY: strength intact, range of motion unable to perform   RIGHT UPPER EXTREMITY:  full range of motion, normal strength & tone LEFT LOWER EXTREMITY:  full range of motion, normal strength & tone RIGHT LOWER EXTREMITY:  full  range of motion, normal strength & tone PSYCHIATRIC: the patient is alert & oriented to person, affect & behavior appropriate  LABS/RADIOLOGY: CBC and BMP normal.    Lumbar spine x-ray:  Did not show acute findings.    ASSESSMENT/PLAN:  Atrial fibrillation.  Rate controlled.    Hypothyroidism.  Well controlled.      Diabetes mellitus with vascular complications.  Well controlled.    COPD.  Well compensated.    Hypertension.  Well controlled.    Osteoporosis.  Continue Fosamax.    I have reviewed patient's  medical records received at admission/from hospitalization.  CPT CODE: 16109

## 2013-07-25 DIAGNOSIS — M810 Age-related osteoporosis without current pathological fracture: Secondary | ICD-10-CM | POA: Insufficient documentation

## 2013-07-25 DIAGNOSIS — F329 Major depressive disorder, single episode, unspecified: Secondary | ICD-10-CM | POA: Insufficient documentation

## 2013-07-25 DIAGNOSIS — F32A Depression, unspecified: Secondary | ICD-10-CM | POA: Insufficient documentation

## 2013-07-25 DIAGNOSIS — E119 Type 2 diabetes mellitus without complications: Secondary | ICD-10-CM | POA: Insufficient documentation

## 2013-07-25 NOTE — Progress Notes (Signed)
Patient ID: Alison Singleton, female   DOB: 01/01/1917, 79 y.o.   MRN: [id number]       PROGRESS NOTE  DATE: 07/04/2013  FACILITY: Nursing Home Location: [facility]  LEVEL OF CARE: SNF (31)  Routine Visit  CHIEF COMPLAINT:  Manage atrial fibrillation osteoporosis and hypothyroid disease and hypothyroidism  HISTORY OF PRESENT ILLNESS:  REASSESSMENT OF ONGOING PROBLEM(S):  HYPOTHYROIDISM: The hypothyroidism remains stable. No complications noted from the medications presently being used.  The patient denies fatigue or constipation.  9/14 TSH 4.151  ATRIAL FIBRILLATION: the patients atrial fibrillation remains stable.  The patient denies DOE, tachycardia, orthopnea, transient neurological sx, pedal edema, palpitations, & PNDs.  No complications noted from the medications currently being used.  OSTEOPOROSIS: The patient's osteoporosis remains stable. The patient denies recent worsening of pain and the range of motion remains stable. There has not been any evidence of fractures. No complications noted from the medications presently being used.  PAST MEDICAL HISTORY : Reviewed.  No changes.  CURRENT MEDICATIONS: Reviewed per [facility]  REVIEW OF SYSTEMS:  GENERAL: no change in appetite, no fatigue, no weight changes, no fever, chills or weakness RESPIRATORY: no cough, SOB, DOE, wheezing, hemoptysis CARDIAC: no chest pain, edema or palpitations GI: no abdominal pain, diarrhea, constipation, heart burn, nausea or vomiting  PHYSICAL EXAMINATION  VS:  T 98.9       P 78      RR 18      BP 130/72     POX 96 %     WT 132.2 (Lb)  GENERAL: no acute distress, normal body habitus EYES: conjunctivae normal, sclerae normal, normal eye lids NECK: supple, trachea midline, no neck masses, no thyroid tenderness, no thyromegaly LYMPHATICS: no LAN in the neck, no supraclavicular LAN RESPIRATORY: breathing is even & unlabored, BS CTAB CARDIAC: Irregularly irregular, no murmur,no extra heart  sounds, no edema GI: abdomen soft, normal BS, no masses, no tenderness, no hepatomegaly, no splenomegaly PSYCHIATRIC: the patient is alert & oriented to person, affect sad  LABS/RADIOLOGY: 06/09/13 WBC 8.8 hemoglobin 13.7 hematocrit 40.8 sodium 136 potassium 3.6 glucose 79 BUN 16 creatinine 0.69 calcium 8.8 06/07/13 TSH 4.151   ASSESSMENT/PLAN:  Atrial fibrillation - rate controlled  Depression - start Lexapro 10 mg one by mouth daily;  check BMP, CBC, and TSH  Diabetes mellitus - diet controlled  Osteoporosis - continue Fosamax  Hypothyroidism - continue Synthroid     CPT CODE: 30865

## 2013-08-02 ENCOUNTER — Non-Acute Institutional Stay (SKILLED_NURSING_FACILITY): Payer: Medicare Other | Admitting: Adult Health

## 2013-08-02 DIAGNOSIS — M810 Age-related osteoporosis without current pathological fracture: Secondary | ICD-10-CM

## 2013-08-02 DIAGNOSIS — K5900 Constipation, unspecified: Secondary | ICD-10-CM

## 2013-08-02 DIAGNOSIS — F329 Major depressive disorder, single episode, unspecified: Secondary | ICD-10-CM

## 2013-08-02 DIAGNOSIS — F32A Depression, unspecified: Secondary | ICD-10-CM

## 2013-08-02 DIAGNOSIS — E119 Type 2 diabetes mellitus without complications: Secondary | ICD-10-CM

## 2013-08-02 DIAGNOSIS — I4891 Unspecified atrial fibrillation: Secondary | ICD-10-CM

## 2013-08-02 DIAGNOSIS — E039 Hypothyroidism, unspecified: Secondary | ICD-10-CM

## 2013-08-15 DIAGNOSIS — K5900 Constipation, unspecified: Secondary | ICD-10-CM | POA: Insufficient documentation

## 2013-08-15 NOTE — Progress Notes (Signed)
Patient ID: Alison Singleton, female   DOB: 30-Sep-1916, 79 y.o.   MRN: [id number]              PROGRESS NOTE  DATE: 08/02/2013   FACILITY: [facility]  LEVEL OF CARE: SNF (31)  Acute Visit  CHIEF COMPLAINT:  Discharge Notes  HISTORY OF PRESENT ILLNESS: This is a 79 year old female who is for discharge home to [place] with Home health PT, OT, Nurse and Home health Aide. She has been admitted to [facility] on 06/11/13 from [facility] with primary discharge diagnosis of Atrial Fibrillation. Patient was admitted to this facility for short-term rehabilitation after the patient's recent hospitalization.  Patient has completed SNF rehabilitation and therapy has cleared the patient for discharge.  Reassessment of ongoing problem(s):  DEPRESSION: The depression remains stable. Patient denies ongoing feelings of sadness, insomnia, anedhonia or lack of appetite. No complications reported from the medications currently being used. Staff do not report behavioral problems.  ATRIAL FIBRILLATION: the patients atrial fibrillation remains stable.  The patient denies DOE, tachycardia, orthopnea, transient neurological sx, pedal edema, palpitations, & PNDs.  No complications noted from the medications currently being used.  CONSTIPATION: The constipation remains stable. No complications from the medications presently being used. Patient denies ongoing constipation, abdominal pain, nausea or vomiting.  PAST MEDICAL HISTORY : Reviewed.  No changes.  CURRENT MEDICATIONS: Reviewed per [facility]  REVIEW OF SYSTEMS:  GENERAL: no change in appetite, no fatigue, no weight changes, no fever, chills or weakness RESPIRATORY: no cough, SOB, DOE, wheezing, hemoptysis CARDIAC: no chest pain, edema or palpitations GI: no abdominal pain, diarrhea, constipation, heart burn, nausea or vomiting  PHYSICAL EXAMINATION  VS:  T97.9       P72       RR20      BP134/70      POX96 %       WT134  (Lb)  GENERAL: no acute distress, normal body habitus NECK: supple, trachea midline, no neck masses, no thyroid tenderness, no thyromegaly LYMPHATICS: no LAN in the neck, no supraclavicular LAN RESPIRATORY: breathing is even & unlabored, BS CTAB CARDIAC: RRR, no murmur,no extra heart sounds, no edema GI: abdomen soft, normal BS, no masses, no tenderness, no hepatomegaly, no splenomegaly PSYCHIATRIC: the patient is alert & oriented to person, affect & behavior appropriate  LABS/RADIOLOGY: 07/05/13  Wbc 9.2  hgb 14.8  hct 45.3  NA 137  K 4.2  Glucose 120  BUN 16  Creatinine 0.8   Calcium 9.1 06/09/13 WBC 8.8 hemoglobin 13.7 hematocrit 40.8 sodium 136 potassium 3.6 glucose 79 BUN 16 creatinine 0.69 calcium 8.8 06/07/13 TSH 4.151  ASSESSMENT/PLAN:  Atrial fibrillation - rate controlled  Depression - continue Lexapro  Diabetes mellitus - diet controlled  Osteoporosis - continue Fosamax  Hypothyroidism - continue Synthroid  Constipation - no complaints   I have filled out patient's discharge paperwork and written prescriptions.  Patient will receive home health PT, OT and Nursing.   Total discharge time: Less than 30 minutes Discharge time involved coordination of the discharge process with Child psychotherapist, nursing staff and therapy department. Medical justification for home health services verified.   CPT CODE: 10272

## 2013-10-02 ENCOUNTER — Encounter (HOSPITAL_COMMUNITY): Payer: Self-pay | Admitting: Emergency Medicine

## 2013-10-02 ENCOUNTER — Emergency Department (HOSPITAL_COMMUNITY): Payer: Medicare Other

## 2013-10-02 ENCOUNTER — Inpatient Hospital Stay (HOSPITAL_COMMUNITY)
Admission: EM | Admit: 2013-10-02 | Discharge: 2013-10-05 | DRG: 309 | Disposition: A | Discharge status: Skilled Nursing Facility | Payer: Medicare Other | Attending: Internal Medicine | Admitting: Internal Medicine

## 2013-10-02 DIAGNOSIS — W19XXXA Unspecified fall, initial encounter: Secondary | ICD-10-CM | POA: Diagnosis present

## 2013-10-02 DIAGNOSIS — E785 Hyperlipidemia, unspecified: Secondary | ICD-10-CM | POA: Diagnosis present

## 2013-10-02 DIAGNOSIS — Z8673 Personal history of transient ischemic attack (TIA), and cerebral infarction without residual deficits: Secondary | ICD-10-CM

## 2013-10-02 DIAGNOSIS — I4891 Unspecified atrial fibrillation: Principal | ICD-10-CM | POA: Diagnosis present

## 2013-10-02 DIAGNOSIS — E039 Hypothyroidism, unspecified: Secondary | ICD-10-CM | POA: Diagnosis present

## 2013-10-02 DIAGNOSIS — E119 Type 2 diabetes mellitus without complications: Secondary | ICD-10-CM | POA: Diagnosis present

## 2013-10-02 DIAGNOSIS — R269 Unspecified abnormalities of gait and mobility: Secondary | ICD-10-CM | POA: Diagnosis present

## 2013-10-02 DIAGNOSIS — I10 Essential (primary) hypertension: Secondary | ICD-10-CM | POA: Diagnosis present

## 2013-10-02 DIAGNOSIS — J4489 Other specified chronic obstructive pulmonary disease: Secondary | ICD-10-CM | POA: Diagnosis present

## 2013-10-02 DIAGNOSIS — I714 Abdominal aortic aneurysm, without rupture, unspecified: Secondary | ICD-10-CM | POA: Diagnosis present

## 2013-10-02 DIAGNOSIS — F329 Major depressive disorder, single episode, unspecified: Secondary | ICD-10-CM | POA: Diagnosis present

## 2013-10-02 DIAGNOSIS — M25529 Pain in unspecified elbow: Secondary | ICD-10-CM | POA: Diagnosis present

## 2013-10-02 DIAGNOSIS — F3289 Other specified depressive episodes: Secondary | ICD-10-CM | POA: Diagnosis present

## 2013-10-02 DIAGNOSIS — N390 Urinary tract infection, site not specified: Secondary | ICD-10-CM | POA: Diagnosis present

## 2013-10-02 DIAGNOSIS — M810 Age-related osteoporosis without current pathological fracture: Secondary | ICD-10-CM | POA: Diagnosis present

## 2013-10-02 DIAGNOSIS — I6529 Occlusion and stenosis of unspecified carotid artery: Secondary | ICD-10-CM | POA: Diagnosis present

## 2013-10-02 DIAGNOSIS — Z66 Do not resuscitate: Secondary | ICD-10-CM | POA: Diagnosis present

## 2013-10-02 DIAGNOSIS — Z7982 Long term (current) use of aspirin: Secondary | ICD-10-CM

## 2013-10-02 DIAGNOSIS — Z87891 Personal history of nicotine dependence: Secondary | ICD-10-CM

## 2013-10-02 DIAGNOSIS — I359 Nonrheumatic aortic valve disorder, unspecified: Secondary | ICD-10-CM | POA: Diagnosis present

## 2013-10-02 DIAGNOSIS — J449 Chronic obstructive pulmonary disease, unspecified: Secondary | ICD-10-CM | POA: Diagnosis present

## 2013-10-02 HISTORY — DX: Unspecified macular degeneration: H35.30

## 2013-10-02 HISTORY — DX: Shortness of breath: R06.02

## 2013-10-02 HISTORY — DX: Cardiac arrhythmia, unspecified: I49.9

## 2013-10-02 LAB — URINALYSIS, ROUTINE W REFLEX MICROSCOPIC
Bilirubin Urine: NEGATIVE
Glucose, UA: NEGATIVE mg/dL
Hgb urine dipstick: NEGATIVE
KETONES UR: NEGATIVE mg/dL
NITRITE: NEGATIVE
PH: 6.5 (ref 5.0–8.0)
PROTEIN: NEGATIVE mg/dL
Specific Gravity, Urine: 1.023 (ref 1.005–1.030)
UROBILINOGEN UA: 1 mg/dL (ref 0.0–1.0)

## 2013-10-02 LAB — CG4 I-STAT (LACTIC ACID): Lactic Acid, Venous: 1.63 mmol/L (ref 0.5–2.2)

## 2013-10-02 LAB — HEPATIC FUNCTION PANEL
ALK PHOS: 71 U/L (ref 39–117)
ALT: 10 U/L (ref 0–35)
AST: 15 U/L (ref 0–37)
Albumin: 3.2 g/dL — ABNORMAL LOW (ref 3.5–5.2)
TOTAL PROTEIN: 6.7 g/dL (ref 6.0–8.3)
Total Bilirubin: 0.7 mg/dL (ref 0.3–1.2)

## 2013-10-02 LAB — BASIC METABOLIC PANEL
BUN: 15 mg/dL (ref 6–23)
CALCIUM: 8.6 mg/dL (ref 8.4–10.5)
CO2: 23 meq/L (ref 19–32)
CREATININE: 0.68 mg/dL (ref 0.50–1.10)
Chloride: 100 mEq/L (ref 96–112)
GFR calc Af Amer: 84 mL/min — ABNORMAL LOW (ref >90)
GFR calc non Af Amer: 73 mL/min — ABNORMAL LOW (ref >90)
Glucose, Bld: 116 mg/dL — ABNORMAL HIGH (ref 70–99)
Potassium: 4.2 mEq/L (ref 3.7–5.3)
Sodium: 138 mEq/L (ref 137–147)

## 2013-10-02 LAB — CBC WITH DIFFERENTIAL/PLATELET
BASOS ABS: 0 10*3/uL (ref 0.0–0.1)
BASOS PCT: 0 % (ref 0–1)
EOS PCT: 0 % (ref 0–5)
Eosinophils Absolute: 0.1 10*3/uL (ref 0.0–0.7)
HEMATOCRIT: 41.6 % (ref 36.0–46.0)
Hemoglobin: 14.2 g/dL (ref 12.0–15.0)
Lymphocytes Relative: 18 % (ref 12–46)
Lymphs Abs: 2.3 10*3/uL (ref 0.7–4.0)
MCH: 32.4 pg (ref 26.0–34.0)
MCHC: 34.1 g/dL (ref 30.0–36.0)
MCV: 95 fL (ref 78.0–100.0)
MONO ABS: 1.4 10*3/uL — AB (ref 0.1–1.0)
MONOS PCT: 10 % (ref 3–12)
Neutro Abs: 9.3 10*3/uL — ABNORMAL HIGH (ref 1.7–7.7)
Neutrophils Relative %: 72 % (ref 43–77)
Platelets: 228 10*3/uL (ref 150–400)
RBC: 4.38 MIL/uL (ref 3.87–5.11)
RDW: 13.5 % (ref 11.5–15.5)
WBC: 13.1 10*3/uL — ABNORMAL HIGH (ref 4.0–10.5)

## 2013-10-02 LAB — URINE MICROSCOPIC-ADD ON

## 2013-10-02 LAB — POCT I-STAT TROPONIN I: Troponin i, poc: 0 ng/mL (ref 0.00–0.08)

## 2013-10-02 MED ORDER — SODIUM CHLORIDE 0.9 % IV SOLN
INTRAVENOUS | Status: AC
  Administered 2013-10-02: 23:00:00 via INTRAVENOUS

## 2013-10-02 MED ORDER — ACETAMINOPHEN 325 MG PO TABS
650.0000 mg | ORAL_TABLET | Freq: Once | ORAL | Status: AC
  Administered 2013-10-02: 650 mg via ORAL
  Filled 2013-10-02: qty 2

## 2013-10-02 MED ORDER — DEXTROSE 5 % IV SOLN
1.0000 g | Freq: Once | INTRAVENOUS | Status: AC
  Administered 2013-10-02: 1 g via INTRAVENOUS
  Filled 2013-10-02: qty 10

## 2013-10-02 MED ORDER — DILTIAZEM HCL 100 MG IV SOLR
5.0000 mg/h | INTRAVENOUS | Status: DC
  Filled 2013-10-02: qty 100

## 2013-10-02 MED ORDER — SODIUM CHLORIDE 0.9 % IV BOLUS (SEPSIS)
500.0000 mL | Freq: Once | INTRAVENOUS | Status: AC
  Administered 2013-10-02: 500 mL via INTRAVENOUS
  Filled 2013-10-02: qty 500

## 2013-10-02 MED ORDER — DILTIAZEM LOAD VIA INFUSION
10.0000 mg | Freq: Once | INTRAVENOUS | Status: DC
  Filled 2013-10-02: qty 10

## 2013-10-02 NOTE — ED Notes (Signed)
Attempted report 

## 2013-10-02 NOTE — ED Provider Notes (Signed)
CSN: [id number]     Arrival date & time 10/02/13  1905 History   First MD Initiated Contact with Patient 10/02/13 1957     Chief Complaint  Patient presents with  . Atrial Fibrillation   (Consider location/radiation/quality/duration/timing/severity/associated sxs/prior Treatment) HPI Comments: 80 year old female presents by EMS from a the orthopedic clinic due to atrial fibrillation with rapid ventricular response. She fell and had acute on chronic elbow pain. Per the daughter the patient had negative x-rays for her elbow. History taken mostly from the daughter as the patient states she's not know what is going on. The patient does state she has some back pain but denies any cough. The daughter states patient has been in otherwise good health has not been altered or having fevers. No urinary symptoms. She has history of atrial fibrillation this had been brought into the hospital for rate control before.   Past Medical History  Diagnosis Date  . Hypertension   . CVA (cerebral infarction)   . Hyperlipidemia   . Hypothyroid   . Diabetes mellitus   . COPD (chronic obstructive pulmonary disease)   . Osteoporosis   . Cataracts, bilateral    Past Surgical History  Procedure Laterality Date  . Tonsillectomy     Family History  Problem Relation Age of Onset  . Heart disease      No family history   History  Substance Use Topics  . Smoking status: Former Games developer  . Smokeless tobacco: Not on file  . Alcohol Use: No   OB History   Grav Para Term Preterm Abortions TAB SAB Ect Mult Living                 Review of Systems  Constitutional: Negative for fever.  Respiratory: Negative for shortness of breath.   Cardiovascular: Negative for chest pain and palpitations.  Gastrointestinal: Negative for vomiting.  Genitourinary: Negative for dysuria.  Musculoskeletal: Negative for joint swelling.  All other systems reviewed and are negative.    Allergies  Claritin  Home Medications    Current Outpatient Rx  Name  Route  Sig  Dispense  Refill  . alendronate (FOSAMAX) 70 MG tablet   Oral   Take 70 mg by mouth every 7 (seven) days. Take with a full glass of water on an empty stomach.         Marland Kitchen aspirin EC 325 MG EC tablet   Oral   Take 1 tablet (325 mg total) by mouth daily.   30 tablet   0   . diltiazem (CARDIZEM CD) 120 MG 24 hr capsule   Oral   Take 1 capsule (120 mg total) by mouth daily.   30 capsule   11   . levothyroxine (SYNTHROID, LEVOTHROID) 75 MCG tablet   Oral   Take 75 mcg by mouth daily before breakfast.          BP 146/106  Temp(Src) 99.4 F (37.4 C) (Oral)  Resp 20  SpO2 96% Physical Exam  Nursing note and vitals reviewed. Constitutional: She is oriented to person, place, and time. She appears well-developed and well-nourished.  HENT:  Head: Normocephalic and atraumatic.  Right Ear: External ear normal.  Left Ear: External ear normal.  Nose: Nose normal.  Eyes: Right eye exhibits no discharge. Left eye exhibits no discharge.  Cardiovascular: Normal heart sounds.  An irregularly irregular rhythm present. Tachycardia present.   Pulmonary/Chest: Effort normal and breath sounds normal. She has no rales.  Abdominal: Soft. There is no  tenderness. There is no CVA tenderness.  Musculoskeletal:       Thoracic back: She exhibits tenderness.  Neurological: She is alert and oriented to person, place, and time.  Skin: Skin is warm and dry.    ED Course  Procedures (including critical care time) Labs Review Labs Reviewed  CBC WITH DIFFERENTIAL - Abnormal; Notable for the following:    WBC 13.1 (*)    Neutro Abs 9.3 (*)    Monocytes Absolute 1.4 (*)    All other components within normal limits  BASIC METABOLIC PANEL - Abnormal; Notable for the following:    Glucose, Bld 116 (*)    GFR calc non Af Amer 73 (*)    GFR calc Af Amer 84 (*)    All other components within normal limits  HEPATIC FUNCTION PANEL - Abnormal; Notable for the  following:    Albumin 3.2 (*)    All other components within normal limits  URINALYSIS, ROUTINE W REFLEX MICROSCOPIC - Abnormal; Notable for the following:    APPearance CLOUDY (*)    Leukocytes, UA TRACE (*)    All other components within normal limits  URINE MICROSCOPIC-ADD ON - Abnormal; Notable for the following:    Squamous Epithelial / LPF FEW (*)    Bacteria, UA MANY (*)    All other components within normal limits  CULTURE, BLOOD (ROUTINE X 2)  CULTURE, BLOOD (ROUTINE X 2)  INFLUENZA PANEL BY PCR (TYPE A & B, H1N1)  CG4 I-STAT (LACTIC ACID)  POCT I-STAT TROPONIN I   Imaging Review Dg Chest 2 View  10/02/2013   CLINICAL DATA:  Atrial fibrillation  EXAM: CHEST  2 VIEW  COMPARISON:  October 11, 2007  FINDINGS: There is eventration of the right hemidiaphragm, stable. There is no edema or consolidation. Heart size and pulmonary vascularity are normal. No adenopathy. Aorta is prominent and tortuous but stable. There is a hiatal hernia present. There is arthropathy in both shoulders.  IMPRESSION: No edema or consolidation. Stable eventration of the right hemidiaphragm. Aorta is prominent and tortuous with atherosclerotic change. Suspect chronic hypertension. Hiatal hernia present.   Electronically Signed   By: Bretta BangWilliam  Woodruff M.D.   On: 10/02/2013 21:00    EKG Interpretation    Date/Time:    Ventricular Rate:    PR Interval:    QRS Duration:   QT Interval:    QTC Calculation:   R Axis:     Text Interpretation:              Date: 10/03/2013  Rate: 149  Rhythm: atrial fibrillation  QRS Axis: normal  Intervals: QT prolonged  ST/T Wave abnormalities: normal  Conduction Disutrbances:none  Narrative Interpretation:   Old EKG Reviewed: none available   MDM   1. Atrial fibrillation with RVR   2. UTI (urinary tract infection)    Patient with incidental afib with RVR found at ortho office. Febrile here, based on workup likely soruce is UTI with many bacteria. Not  altered per family. Initially rate in 140s, trended down with antipyretics and small fluids. Prior to diltiazem being given HR trended into 120s, with good BP in 140s. Feel that in setting of sepsis further HR control could be deleterious. D/w Dr. Jacky KindleAronson, will admit to telemetry with holding orders.     Audree CamelScott T Olegario Emberson, MD 10/03/13 [phone]

## 2013-10-02 NOTE — ED Notes (Signed)
[facility] for c/o increased heart rate. She was there for elbow pain. The elbow pain is not new it is a flare up from 7 years ago. Ortho only put a sling on it. Ortho office noticed a fast rate when they checked pulse ox and sent her here.

## 2013-10-03 DIAGNOSIS — I4891 Unspecified atrial fibrillation: Secondary | ICD-10-CM | POA: Diagnosis present

## 2013-10-03 LAB — COMPREHENSIVE METABOLIC PANEL
ALBUMIN: 2.8 g/dL — AB (ref 3.5–5.2)
ALT: 8 U/L (ref 0–35)
AST: 13 U/L (ref 0–37)
Alkaline Phosphatase: 63 U/L (ref 39–117)
BILIRUBIN TOTAL: 1.1 mg/dL (ref 0.3–1.2)
BUN: 12 mg/dL (ref 6–23)
CALCIUM: 8.7 mg/dL (ref 8.4–10.5)
CO2: 23 meq/L (ref 19–32)
CREATININE: 0.57 mg/dL (ref 0.50–1.10)
Chloride: 103 mEq/L (ref 96–112)
GFR calc Af Amer: 89 mL/min — ABNORMAL LOW (ref >90)
GFR, EST NON AFRICAN AMERICAN: 77 mL/min — AB (ref >90)
Glucose, Bld: 103 mg/dL — ABNORMAL HIGH (ref 70–99)
Potassium: 4.1 mEq/L (ref 3.7–5.3)
Sodium: 137 mEq/L (ref 137–147)
Total Protein: 6.4 g/dL (ref 6.0–8.3)

## 2013-10-03 LAB — CBC
HEMATOCRIT: 39.3 % (ref 36.0–46.0)
Hemoglobin: 13.4 g/dL (ref 12.0–15.0)
MCH: 32.4 pg (ref 26.0–34.0)
MCHC: 34.1 g/dL (ref 30.0–36.0)
MCV: 94.9 fL (ref 78.0–100.0)
PLATELETS: 200 10*3/uL (ref 150–400)
RBC: 4.14 MIL/uL (ref 3.87–5.11)
RDW: 13.6 % (ref 11.5–15.5)
WBC: 11.4 10*3/uL — ABNORMAL HIGH (ref 4.0–10.5)

## 2013-10-03 LAB — GLUCOSE, CAPILLARY
GLUCOSE-CAPILLARY: 150 mg/dL — AB (ref 70–99)
Glucose-Capillary: 109 mg/dL — ABNORMAL HIGH (ref 70–99)

## 2013-10-03 LAB — MAGNESIUM: Magnesium: 2 mg/dL (ref 1.5–2.5)

## 2013-10-03 LAB — INFLUENZA PANEL BY PCR (TYPE A & B)
H1N1 flu by pcr: NOT DETECTED
INFLAPCR: NEGATIVE
Influenza B By PCR: NEGATIVE

## 2013-10-03 LAB — TSH: TSH: 0.399 u[IU]/mL (ref 0.350–4.500)

## 2013-10-03 MED ORDER — SODIUM CHLORIDE 0.9 % IV SOLN
INTRAVENOUS | Status: AC
  Administered 2013-10-03: 09:00:00 via INTRAVENOUS

## 2013-10-03 MED ORDER — ASPIRIN EC 325 MG PO TBEC
325.0000 mg | DELAYED_RELEASE_TABLET | Freq: Every day | ORAL | Status: DC
  Administered 2013-10-03 – 2013-10-05 (×3): 325 mg via ORAL
  Filled 2013-10-03 (×3): qty 1

## 2013-10-03 MED ORDER — ENOXAPARIN SODIUM 30 MG/0.3ML ~~LOC~~ SOLN
30.0000 mg | SUBCUTANEOUS | Status: DC
  Administered 2013-10-03: 30 mg via SUBCUTANEOUS
  Filled 2013-10-03 (×2): qty 0.3

## 2013-10-03 MED ORDER — ACETAMINOPHEN 325 MG PO TABS
650.0000 mg | ORAL_TABLET | Freq: Four times a day (QID) | ORAL | Status: DC | PRN
  Administered 2013-10-03 – 2013-10-04 (×3): 650 mg via ORAL
  Filled 2013-10-03 (×3): qty 2

## 2013-10-03 MED ORDER — DEXTROSE 5 % IV SOLN
1.0000 g | INTRAVENOUS | Status: DC
  Administered 2013-10-03: 1 g via INTRAVENOUS
  Filled 2013-10-03: qty 10

## 2013-10-03 MED ORDER — DOCUSATE SODIUM 100 MG PO CAPS
100.0000 mg | ORAL_CAPSULE | Freq: Every day | ORAL | Status: DC
  Administered 2013-10-03 – 2013-10-05 (×3): 100 mg via ORAL
  Filled 2013-10-03 (×3): qty 1

## 2013-10-03 MED ORDER — LEVOTHYROXINE SODIUM 75 MCG PO TABS
75.0000 ug | ORAL_TABLET | Freq: Every day | ORAL | Status: DC
  Administered 2013-10-04 – 2013-10-05 (×2): 75 ug via ORAL
  Filled 2013-10-03 (×3): qty 1

## 2013-10-03 MED ORDER — SODIUM CHLORIDE 0.9 % IJ SOLN
3.0000 mL | Freq: Two times a day (BID) | INTRAMUSCULAR | Status: DC
  Administered 2013-10-03 – 2013-10-05 (×5): 3 mL via INTRAVENOUS

## 2013-10-03 MED ORDER — ACETAMINOPHEN 650 MG RE SUPP: 650.0000 mg | Freq: Four times a day (QID) | RECTAL | Status: DC | PRN

## 2013-10-03 MED ORDER — DILTIAZEM HCL 60 MG PO TABS
60.0000 mg | ORAL_TABLET | Freq: Four times a day (QID) | ORAL | Status: DC
  Administered 2013-10-03 – 2013-10-04 (×4): 60 mg via ORAL
  Filled 2013-10-03 (×8): qty 1

## 2013-10-03 NOTE — Progress Notes (Signed)
TC to MD to receive orders for pt. Informed that she is a new admission.Response was that ER should have orders informed that she has basic orders but her heart rate is in the 140's and that [facility] addmission inform me to call this service for orders MD gave orders for diet but when asked about any medication for her heart rate I was told to " let her settle down from to transfer" Charge Rn was informed of situation. Will continue to monitor. Ilean SkillVeronica Terilynn Buresh LPN

## 2013-10-03 NOTE — Progress Notes (Signed)
Utilization review completed. Elian Gloster, RN, BSN. 

## 2013-10-03 NOTE — Progress Notes (Addendum)
Clinical Social Work Department CLINICAL SOCIAL WORK PLACEMENT NOTE 10/03/2013  Patient:  Alison Singleton,[name]  Account Number:  [id number] Admit date:  10/02/2013  Clinical Social Worker:  Sharol HarnessPOONUM Whitni Pasquini, Theresia MajorsLCSWA  Date/time:  10/03/2013 04:00 PM  Clinical Social Work is seeking post-discharge placement for this patient at the following level of care:   SKILLED NURSING   (*CSW will update this form in Epic as items are completed)   10/03/2013  Patient/family provided with [facility] Department of Clinical Social Work's list of facilities offering this level of care within the geographic area requested by the patient (or if unable, by the patient's family).  10/03/2013  Patient/family informed of their freedom to choose among providers that offer the needed level of care, that participate in Medicare, Medicaid or managed care program needed by the patient, have an available bed and are willing to accept the patient.  10/03/2013  Patient/family informed of [facility]' ownership interest in [facility], as well as of the fact that they are under no obligation to receive care at this facility.  PASARR submitted to EDS on EXISTING PASARR number received from EDS on   FL2 transmitted to all facilities in geographic area requested by pt/family on  10/03/2013 FL2 transmitted to all facilities within larger geographic area on   Patient informed that his/her managed care company has contracts with or will negotiate with  certain facilities, including the following:     Patient/family informed of bed offers received:   Patient chooses bed at  Physician recommends and patient chooses bed at    Patient to be transferred to  on   Patient to be transferred to facility by   The following physician request were entered in Epic:   Additional CommentsSharol Harness:  Frisco Cordts, LCSWA [phone]

## 2013-10-03 NOTE — Evaluation (Signed)
Physical Therapy Evaluation Patient Details Name: Alison Singleton MRN: [id number] DOB: [name] Today's Date: 10/03/2013 Time: [id number] PT Time Calculation (min): 19 min  PT Assessment / Plan / Recommendation History of Present Illness  pt presents with UTI, A-fib with RVR, and chronic L elbow injury.    Clinical Impression  Pt generally unsteady and deconditioned.  At this time pt is not safe for return to Independent Living and would benefit from ALF if pt is able to move to ALF portion of [place].  If pt can not move to ALF will need to consider SNF for safety.      PT Assessment  Patient needs continued PT services    Follow Up Recommendations  Home health PT;Supervision - Intermittent (at ALF)    Does the patient have the potential to tolerate intense rehabilitation      Barriers to Discharge        Equipment Recommendations  Rolling walker with 5" wheels    Recommendations for Other Services     Frequency Min 3X/week    Precautions / Restrictions Precautions Precautions: Fall Precaution Comments: Sling in room for comfort.   Restrictions Weight Bearing Restrictions: No   Pertinent Vitals/Pain L elbow during ROM.        Mobility  Bed Mobility Overal bed mobility: Modified Independent General bed mobility comments: Moves slowly and uses bed rails.   Transfers Overall transfer level: Needs assistance Equipment used: Rolling walker (2 wheeled) Transfers: Sit to/from Stand Sit to Stand: Min guard General transfer comment: cues for UE use and controlling descent to sitting.  pt gets distracted conversing and does need cues to focus on task.   Ambulation/Gait Ambulation/Gait assistance: Min guard Ambulation Distance (Feet): 150 Feet Assistive device: Rolling walker (2 wheeled) Gait Pattern/deviations: Step-through pattern;Decreased stride length;Trunk flexed General Gait Details: pt mildly unsteady and needs use of RW.      Exercises     PT Diagnosis:  Difficulty walking  PT Problem List: Decreased strength;Decreased activity tolerance;Decreased balance;Decreased mobility;Decreased knowledge of use of DME PT Treatment Interventions: DME instruction;Gait training;Functional mobility training;Therapeutic activities;Therapeutic exercise;Balance training;Patient/family education     PT Goals(Current goals can be found in the care plan section) Acute Rehab PT Goals Patient Stated Goal: Feel better PT Goal Formulation: With patient Time For Goal Achievement: 10/17/13 Potential to Achieve Goals: Fair  Visit Information  Last PT Received On: 10/03/13 Assistance Needed: +1 History of Present Illness: pt presents with UTI, A-fib with RVR, and chronic L elbow injury.         Prior Functioning  Home Living Family/patient expects to be discharged to:: Assisted living Home Equipment: Walker - 4 wheels;Cane - single point Additional Comments: [facility] and would benefit from move to ALF level.  If not able to move to ALF will need SNF at D/C.   Prior Function Level of Independence: Independent with assistive device(s) Comments: Except pt indicates that for the last 6 weeks she has been getting A for bathing and dressing.   Communication Communication: HOH Dominant Hand: Right    Cognition  Cognition Arousal/Alertness: Awake/alert Behavior During Therapy: WFL for tasks assessed/performed Overall Cognitive Status: Within Functional Limits for tasks assessed    Extremity/Trunk Assessment Upper Extremity Assessment Upper Extremity Assessment: Defer to OT evaluation Lower Extremity Assessment Lower Extremity Assessment: Generalized weakness Cervical / Trunk Assessment Cervical / Trunk Assessment: Kyphotic   Balance Balance Overall balance assessment: Needs assistance Standing balance support: Bilateral upper extremity supported;During  functional activity Standing balance-Leahy Scale: Fair  End of  Session PT - End of Session Equipment Utilized During Treatment: Gait belt Activity Tolerance: Patient tolerated treatment well Patient left: in bed;with call bell/phone within reach;with bed alarm set Nurse Communication: Mobility status  GP     Alison Singleton, Alison Singleton [phone] 10/03/2013, 2:48 PM

## 2013-10-03 NOTE — Progress Notes (Signed)
Clinical Social Work Department BRIEF PSYCHOSOCIAL ASSESSMENT 10/03/2013  Patient:  Hessie KnowsCARTER,Tyreanna R     Account Number:  [id number]     Admit date:  10/02/2013  Clinical Social Worker:  Harless NakayamaAMBELAL,Genola Yuille, LCSWA  Date/Time:  10/03/2013 11:20 AM  Referred by:  Physician  Date Referred:  10/03/2013 Referred for  Other - See comment   Other Referral:   Admitted from [facility]   Interview type:  Patient Other interview type:    PSYCHOSOCIAL DATA Living Status:  FACILITY Admitted from facility:  [facility] Level of care:  Independent Living Primary support name:  Audrie GallusJennifer McFadden [phone] Primary support relationship to patient:  CHILD, ADULT Degree of support available:   Pt has supportive family    CURRENT CONCERNS Current Concerns  Post-Acute Placement   Other Concerns:    SOCIAL WORK ASSESSMENT / PLAN CSW informed pt was admitted from [facility]. CSW spoke with pt and pt confirmed that she resides at the independent living. Pt is eager to return. At this time, no CSW needs. However, CSW aware pt has deconditioned and PT consult pending. CSW will continue to check for PT recommendation incase pt will require higher level of care of dc.   Assessment/plan status:  Psychosocial Support/Ongoing Assessment of Needs Other assessment/ plan:   Information/referral to community resources:   None needed at this time    PATIENT'S/FAMILY'S RESPONSE TO PLAN OF CARE: Pt is agreeable and happy to return to independent living if appropriate.       Yoneko Talerico, LCSWA [phone]

## 2013-10-03 NOTE — Progress Notes (Signed)
CSW Proofreader(Clinical Social Worker) spoke with pt about need for rehab. Pt is agreeable and [facility]. Pt did ask if CSW would call and speak with her daughter. CSW spoke with pt daughter over the phone and explained PT recommendation. Pt daughter is agreeable however had concerns about Medicare coverage. CSW explained that pt was admitted in patient on 10/02/13 and if pt were to remain in patient and have a qualifying stay of 3 nights (through Friday night) Medicare would cover SNF. CSW did explain alternative options if pt does not have qualifying stay. Pt daughter to begin considering options.  Nannette Zill, LCSWA [phone]

## 2013-10-03 NOTE — H&P (Signed)
Alison Singleton is an 80 y.o. female.   PCP:   Gwen Pounds, MD   Chief Complaint:  Afib RVR and a UTI  HPI: 88 F c known Afib, H/O Osteoporosis, Hypothyroid, prior CVA, and HTN last admitted 05/2013 who presented to Ortho office yesterday for her Elbow issues.  The elbow pain was considered not new but a flare up from 7 years ago. She fell and had acute on chronic elbow pain. She had negative x-rays for her elbow. Ortho only put a sling on it.  She had HR 140-150s - EMS called and sent her to ED.  In ED She was AFib RVR and UA confirmed UTI. Rocephin given for UTI.  Diltiazem given in ED as well.  Her HR is still in 110-140s on floor.  Her IV is off.  She is asleep.  I woke her - No C/O    Past Medical History:  Past Medical History  Diagnosis Date  . Hypertension   . CVA (cerebral infarction)   . Hyperlipidemia   . Hypothyroid   . Diabetes mellitus   . COPD (chronic obstructive pulmonary disease)   . Osteoporosis   . Cataracts, bilateral     Past Surgical History  Procedure Laterality Date  . Tonsillectomy        Allergies:   Allergies  Allergen Reactions  . Claritin [Loratadine]      Medications: Prior to Admission medications   Medication Sig Start Date End Date Taking? Authorizing Provider  alendronate (FOSAMAX) 70 MG tablet Take 70 mg by mouth every 7 (seven) days. Take with a full glass of water on an empty stomach.    Historical Provider, MD  aspirin EC 325 MG EC tablet Take 1 tablet (325 mg total) by mouth daily. 06/10/13   Gwen Pounds, MD  diltiazem (CARDIZEM CD) 120 MG 24 hr capsule Take 1 capsule (120 mg total) by mouth daily. 06/10/13   Gwen Pounds, MD  levothyroxine (SYNTHROID, LEVOTHROID) 75 MCG tablet Take 75 mcg by mouth daily before breakfast.    Historical Provider, MD     Medications Prior to Admission  Medication Sig Dispense Refill  . alendronate (FOSAMAX) 70 MG tablet Take 70 mg by mouth every 7 (seven) days. Take with a full glass of water on  an empty stomach.      Marland Kitchen aspirin EC 325 MG EC tablet Take 1 tablet (325 mg total) by mouth daily.  30 tablet  0  . diltiazem (CARDIZEM CD) 120 MG 24 hr capsule Take 1 capsule (120 mg total) by mouth daily.  30 capsule  11  . levothyroxine (SYNTHROID, LEVOTHROID) 75 MCG tablet Take 75 mcg by mouth daily before breakfast.         Social History:  reports that she has quit smoking. She does not have any smokeless tobacco history on file. She reports that she does not drink alcohol. Her drug history is not on file. [facility].   Family History: Family History  Problem Relation Age of Onset  . Heart disease      No family history    Review of Systems:  Review of Systems - no CP, SOB or Palpitations. Full ROS obtained. L Elbow pain    Physical Exam:  Blood pressure 107/91, pulse 107, temperature 97.9 F (36.6 C), temperature source Oral, resp. rate 20, height 5\' 4"  (1.626 m), weight 60.147 kg (132 lb 9.6 oz), SpO2 100.00%. Filed Vitals:   10/02/13 2200 10/02/13 2230 10/02/13 2329  10/03/13 0404  BP: 154/114 143/80 143/86 107/91  Pulse: 127  102 107  Temp:   98.2 F (36.8 C) 97.9 F (36.6 C)  TempSrc:   Oral Oral  Resp: 17 24  20   Height:   5\' 4"  (1.626 m)   Weight:   60.147 kg (132 lb 9.6 oz)   SpO2: 97%  95% 100%   General appearance: Elderly, getting frailer.  HOH Head: Normocephalic, without obvious abnormality, atraumatic Eyes: conjunctivae/corneas clear. PERRL, EOM's intact.  Nose: Nares normal. Septum midline. Mucosa normal. No drainage or sinus tenderness. Throat: lips, mucosa, and tongue normal; teeth and gums normal Neck: no adenopathy, no carotid bruit, no JVD and thyroid not enlarged, symmetric, no tenderness/mass/nodules Resp: CTA B Cardio: Irreg Irreg Tachy GI: soft, non-tender; bowel sounds normal; no masses,  no organomegaly Extremities: extremities normal, atraumatic, no cyanosis or edema Pulses: 2+ and symmetric Lymph nodes: no cervical  lymphadenopathy Neurologic: Alert and oriented L Elbow tender.  In a sling    Labs on Admission:   Recent Labs  10/02/13 2030  NA 138  K 4.2  CL 100  CO2 23  GLUCOSE 116*  BUN 15  CREATININE 0.68  CALCIUM 8.6    Recent Labs  10/02/13 2030  AST 15  ALT 10  ALKPHOS 71  BILITOT 0.7  PROT 6.7  ALBUMIN 3.2*   No results found for this basename: LIPASE, AMYLASE,  in the last 72 hours  Recent Labs  10/02/13 2030  WBC 13.1*  NEUTROABS 9.3*  HGB 14.2  HCT 41.6  MCV 95.0  PLT 228   No results found for this basename: CKTOTAL, CKMB, CKMBINDEX, TROPONINI,  in the last 72 hours Lab Results  Component Value Date   INR 1.0 10/11/2007     LAB RESULT POCT:  Results for orders placed during the hospital encounter of 10/02/13  CBC WITH DIFFERENTIAL      Result Value Range   WBC 13.1 (*) 4.0 - 10.5 K/uL   RBC 4.38  3.87 - 5.11 MIL/uL   Hemoglobin 14.2  12.0 - 15.0 g/dL   HCT 16.141.6  09.636.0 - 04.546.0 %   MCV 95.0  78.0 - 100.0 fL   MCH 32.4  26.0 - 34.0 pg   MCHC 34.1  30.0 - 36.0 g/dL   RDW 40.913.5  81.111.5 - 91.415.5 %   Platelets 228  150 - 400 K/uL   Neutrophils Relative % 72  43 - 77 %   Neutro Abs 9.3 (*) 1.7 - 7.7 K/uL   Lymphocytes Relative 18  12 - 46 %   Lymphs Abs 2.3  0.7 - 4.0 K/uL   Monocytes Relative 10  3 - 12 %   Monocytes Absolute 1.4 (*) 0.1 - 1.0 K/uL   Eosinophils Relative 0  0 - 5 %   Eosinophils Absolute 0.1  0.0 - 0.7 K/uL   Basophils Relative 0  0 - 1 %   Basophils Absolute 0.0  0.0 - 0.1 K/uL  BASIC METABOLIC PANEL      Result Value Range   Sodium 138  137 - 147 mEq/L   Potassium 4.2  3.7 - 5.3 mEq/L   Chloride 100  96 - 112 mEq/L   CO2 23  19 - 32 mEq/L   Glucose, Bld 116 (*) 70 - 99 mg/dL   BUN 15  6 - 23 mg/dL   Creatinine, Ser 7.820.68  0.50 - 1.10 mg/dL   Calcium 8.6  8.4 - 95.610.5 mg/dL  GFR calc non Af Amer 73 (*) >90 mL/min   GFR calc Af Amer 84 (*) >90 mL/min  HEPATIC FUNCTION PANEL      Result Value Range   Total Protein 6.7  6.0 - 8.3  g/dL   Albumin 3.2 (*) 3.5 - 5.2 g/dL   AST 15  0 - 37 U/L   ALT 10  0 - 35 U/L   Alkaline Phosphatase 71  39 - 117 U/L   Total Bilirubin 0.7  0.3 - 1.2 mg/dL   Bilirubin, Direct <7.4  0.0 - 0.3 mg/dL   Indirect Bilirubin NOT CALCULATED  0.3 - 0.9 mg/dL  URINALYSIS, ROUTINE W REFLEX MICROSCOPIC      Result Value Range   Color, Urine YELLOW  YELLOW   APPearance CLOUDY (*) CLEAR   Specific Gravity, Urine 1.023  1.005 - 1.030   pH 6.5  5.0 - 8.0   Glucose, UA NEGATIVE  NEGATIVE mg/dL   Hgb urine dipstick NEGATIVE  NEGATIVE   Bilirubin Urine NEGATIVE  NEGATIVE   Ketones, ur NEGATIVE  NEGATIVE mg/dL   Protein, ur NEGATIVE  NEGATIVE mg/dL   Urobilinogen, UA 1.0  0.0 - 1.0 mg/dL   Nitrite NEGATIVE  NEGATIVE   Leukocytes, UA TRACE (*) NEGATIVE  URINE MICROSCOPIC-ADD ON      Result Value Range   Squamous Epithelial / LPF FEW (*) RARE   WBC, UA 0-2  <3 WBC/hpf   Bacteria, UA MANY (*) RARE  GLUCOSE, CAPILLARY      Result Value Range   Glucose-Capillary 150 (*) 70 - 99 mg/dL  CG4 I-STAT (LACTIC ACID)      Result Value Range   Lactic Acid, Venous 1.63  0.5 - 2.2 mmol/L  POCT I-STAT TROPONIN I      Result Value Range   Troponin i, poc 0.00  0.00 - 0.08 ng/mL   Comment 3               Radiological Exams on Admission: Dg Chest 2 View  10/02/2013   CLINICAL DATA:  Atrial fibrillation  EXAM: CHEST  2 VIEW  COMPARISON:  October 11, 2007  FINDINGS: There is eventration of the right hemidiaphragm, stable. There is no edema or consolidation. Heart size and pulmonary vascularity are normal. No adenopathy. Aorta is prominent and tortuous but stable. There is a hiatal hernia present. There is arthropathy in both shoulders.  IMPRESSION: No edema or consolidation. Stable eventration of the right hemidiaphragm. Aorta is prominent and tortuous with atherosclerotic change. Suspect chronic hypertension. Hiatal hernia present.   Electronically Signed   By: Bretta Bang M.D.   On: 10/02/2013 21:00       No orders found for this or any previous visit.   Assessment/Plan Active Problems:   Atrial fibrillation with RVR   AFib RVR - She had one dose Cardizem in the ED.  She is HD stable although HR is fast - Will use Cardizem 60 q 6 orally and may need IV Cardizem boluses.  Dig was D/ced last admission.  She has been on Cardizem CD 120.  She possibly has Tachy - Huston Foley and consideration for possible Pacer was discussed last hospital stay.  Will watch for pauses and consider cards consult as needed.   Last Echo: - Left ventricle: Hypokinesis of apical septal segment. The cavity size was normal. Wall thickness was normal. The estimated ejection fraction was 50%. - Aortic valve: Sclerosis without stenosis. - Mitral valve: Mildly calcified annulus. Mild regurgitation. -  Left atrium: The atrium was moderately to severely dilated. - Right ventricle: Systolic function was mildly reduced. - Right atrium: The atrium was moderately to severely dilated.  Poor Coumadin candidate. Off Plavix since Sept and remains on ASA  UTI - Cxed up. Rocephin given.  Will repeat tonight and convert to orals in am.  B Carotid Mild Stenosis 1-39% ICA stenosis. Vertebral artery flow is antegrade. ICA/CCA ratio: R-1.13 L-1.33  AAA - Atherosclerosis only on last Ab [place] 01/2010 - No further w/up needed. Last Ab X Ray shows @ 4 cm AAA - again no further w/up needed.   Known Anxiety - Not on anything at moment.  Abnormality of gait - Walker.  Will get PT/OT SW.  She resides at [facility].  Hypothyroid - Check TSH  HTN - BP up and down - Will monitor on the increased cardizem  DNR.   DVT Proph ordered.  L Elbow pain - in a sling  Constipation - Prns ordered.   Severe scoliosis and multilevel degenerative issues - NTD.  Osteoporosis   Prior CVA now on ASA   Christyna Letendre M 10/03/2013, 7:39 AM

## 2013-10-04 ENCOUNTER — Encounter (HOSPITAL_COMMUNITY): Payer: Self-pay | Admitting: General Practice

## 2013-10-04 MED ORDER — CEPHALEXIN 500 MG PO CAPS
500.0000 mg | ORAL_CAPSULE | Freq: Two times a day (BID) | ORAL | Status: DC
  Administered 2013-10-04 – 2013-10-05 (×3): 500 mg via ORAL
  Filled 2013-10-04 (×4): qty 1

## 2013-10-04 MED ORDER — ENOXAPARIN SODIUM 40 MG/0.4ML ~~LOC~~ SOLN
40.0000 mg | SUBCUTANEOUS | Status: DC
  Administered 2013-10-04 – 2013-10-05 (×2): 40 mg via SUBCUTANEOUS
  Filled 2013-10-04 (×2): qty 0.4

## 2013-10-04 MED ORDER — DILTIAZEM HCL ER COATED BEADS 180 MG PO CP24
180.0000 mg | ORAL_CAPSULE | Freq: Every day | ORAL | Status: DC
  Administered 2013-10-04 – 2013-10-05 (×2): 180 mg via ORAL
  Filled 2013-10-04 (×2): qty 1

## 2013-10-04 MED ORDER — CEPHALEXIN 500 MG PO CAPS: 500.0000 mg | ORAL_CAPSULE | Freq: Two times a day (BID) | ORAL | Status: AC

## 2013-10-04 MED ORDER — DILTIAZEM HCL ER COATED BEADS 180 MG PO CP24: 180.0000 mg | ORAL_CAPSULE | Freq: Every day | ORAL | Status: DC

## 2013-10-04 MED ORDER — LORAZEPAM 0.5 MG PO TABS
0.2500 mg | ORAL_TABLET | Freq: Two times a day (BID) | ORAL | Status: DC | PRN
  Administered 2013-10-04: 0.25 mg via ORAL
  Filled 2013-10-04: qty 1

## 2013-10-04 MED ORDER — ACETAMINOPHEN 325 MG PO TABS: 650.0000 mg | ORAL_TABLET | Freq: Four times a day (QID) | ORAL | Status: DC | PRN

## 2013-10-04 NOTE — Discharge Summary (Signed)
Physician Discharge Summary  DISCHARGE SUMMARY   Patient ID: Alison Singleton MR#: [id number] DOB/AGE: [birth year] 80 y.o.   Attending Physician:Jalah Warmuth M  Patient's NFA:[id number],MVHQ M, MD  Consults:  none  Admit date: 10/02/2013 Discharge date: 10/04/2013  Discharge Diagnoses:  Active Problems:   Atrial fibrillation with RVR   Atrial fibrillation with rapid ventricular response   Patient Active Problem List   Diagnosis Date Noted  . Atrial fibrillation with rapid ventricular response 10/03/2013  . Atrial fibrillation with RVR 10/02/2013  . Constipation 08/15/2013  . Depression 07/25/2013  . Osteoporosis 07/25/2013  . Diabetes mellitus 07/25/2013  . Unspecified hypothyroidism 07/16/2013  . Type I (juvenile type) diabetes mellitus with peripheral circulatory disorders, not stated as uncontrolled(250.71) 07/16/2013  . Chronic airway obstruction, not elsewhere classified 07/16/2013  . Atrial fibrillation 06/07/2013   Past Medical History  Diagnosis Date  . Hypertension   . CVA (cerebral infarction)   . Hyperlipidemia   . Hypothyroid   . Diabetes mellitus   . COPD (chronic obstructive pulmonary disease)   . Osteoporosis   . Cataracts, bilateral   . Dysrhythmia     ATRIAL FIB  . Shortness of breath   . Macular degeneration disease     Discharged Condition: good   Discharge Medications:   Medication List         acetaminophen 325 MG tablet  Commonly known as:  TYLENOL  Take 2 tablets (650 mg total) by mouth every 6 (six) hours as needed for mild pain or moderate pain (or Fever >/= 101).     alendronate 70 MG tablet  Commonly known as:  FOSAMAX  Take 70 mg by mouth every 7 (seven) days. Take on Sunday.      Take with a full glass of water on an empty stomach.     aspirin 325 MG EC tablet  Take 1 tablet (325 mg total) by mouth daily.     cephALEXin 500 MG capsule  Commonly known as:  KEFLEX  Take 1 capsule (500 mg total) by mouth every 12 (twelve) hours.      diltiazem 180 MG 24 hr capsule  Commonly known as:  CARDIZEM CD  Take 1 capsule (180 mg total) by mouth daily.     docusate sodium 100 MG capsule  Commonly known as:  COLACE  Take 100 mg by mouth daily.     escitalopram 10 MG tablet  Commonly known as:  LEXAPRO  Take 10 mg by mouth daily.     levothyroxine 75 MCG tablet  Commonly known as:  SYNTHROID, LEVOTHROID  Take 75 mcg by mouth daily before breakfast.        Hospital Procedures: Dg Chest 2 View  10/02/2013   CLINICAL DATA:  Atrial fibrillation  EXAM: CHEST  2 VIEW  COMPARISON:  October 11, 2007  FINDINGS: There is eventration of the right hemidiaphragm, stable. There is no edema or consolidation. Heart size and pulmonary vascularity are normal. No adenopathy. Aorta is prominent and tortuous but stable. There is a hiatal hernia present. There is arthropathy in both shoulders.  IMPRESSION: No edema or consolidation. Stable eventration of the right hemidiaphragm. Aorta is prominent and tortuous with atherosclerotic change. Suspect chronic hypertension. Hiatal hernia present.   Electronically Signed   By: Bretta Bang M.D.   On: 10/02/2013 21:00    History of Present Illness: Admitted through the ED with Afib RVR and a UTI.   Hospital Course: 20 F c known Afib, H/O Osteoporosis, Hypothyroid, prior  CVA, and HTN last admitted 05/2013 who presented to Ortho office ([facility]) 1/7 for her Elbow issues. The elbow pain was considered not new but a flare up from 7 years ago. She fell and had acute on chronic elbow pain. She had negative x-rays for her elbow. Ortho only put a sling on it and said she has severe bone on bone OA. She had HR 140-150s - EMS called and sent her to ED. In ED She was AFib RVR and UA confirmed UTI. Rocephin given for UTI. Diltiazem given as well. Her HR was still in 110-140s on arrival to floor. I titrated her oral cardizem to be 60 q 6 hrs.    The following am her HR was well controlled and @ 55-65 -  still Afib.  Working towards rehab.  Her elbow has lots of degenerative changes.  Continue sling  AFib RVR - She had one dose Cardizem in the ED. Her HR was much better controlled on the Cardizem 60 q 6 orally and she transitioned to Cardizem CD 180. 240 may be too much and cause too many pauses. Dig was D/ced last admission. She has been on Cardizem CD 120 prior to this admission. She may still have Tachy Huston Foley and consideration for possible Pacer was discussed last hospital stay. Will watch for pauses and consider cards (Dr Elease Hashimoto) consult as needed. We are trying to avoid pacer unless absolutely necessary.  Last Echo: - Left ventricle: Hypokinesis of apical septal segment. The cavity size was normal. Wall thickness was normal. The estimated ejection fraction was 50%. - Aortic valve: Sclerosis without stenosis. - Mitral valve: Mildly calcified annulus. Mild regurgitation. - Left atrium: The atrium was moderately to severely dilated. - Right ventricle: Systolic function was mildly reduced. - Right atrium: The atrium was moderately to severely dilated.  Poor Coumadin candidate.  Off Plavix since Sept and remains on ASA   UTI c Leukocytosis - improving - Cxed up. Rocephin given x 2. OK to treat/change to oral Abx today. Keflex until course completed  B Carotid Mild Stenosis 1-39% ICA stenosis. Vertebral artery flow is antegrade. ICA/CCA ratio: R-1.13 L-1.33  AAA - Atherosclerosis only on last Ab [place] 01/2010 - No further w/up needed. Last Ab X Ray shows @ 4 cm AAA - again no further w/up needed.   Known Anxiety - Not on anything at moment.   Abnormality of gait - Walker. PT/OT/CW/SW. She resides at [facility]. Will go to [facility] SNF after hospital stay. Anticipate D/C tomorrow 10/05/13.   Hypothyroid - TSH fine at 0.399  HTN - BP up and down - Will monitor on the increased cardizem  DNR. OOF DNR signed  DVT Proph ordered.  L Elbow pain - sling prn and less pain today  Constipation -  Prns ordered.  DM2 - NTD. Monitor-  Recent Labs   10/03/13 0143  10/03/13 0759   GLUCAP  150*  109*      Day of Discharge Exam BP 124/72  Pulse 86  Temp(Src) 98.2 F (36.8 C) (Oral)  Resp 17  Ht 5\' 4"  (1.626 m)  Wt 61.126 kg (134 lb 12.1 oz)  BMI 23.12 kg/m2  SpO2 98%  Physical Exam: See PN Today   Discharge Labs:  Recent Labs  10/02/13 2030 10/03/13 0830  NA 138 137  K 4.2 4.1  CL 100 103  CO2 23 23  GLUCOSE 116* 103*  BUN 15 12  CREATININE 0.68 0.57  CALCIUM 8.6 8.7  MG  --  2.0    Recent Labs  10/02/13 2030 10/03/13 0830  AST 15 13  ALT 10 8  ALKPHOS 71 63  BILITOT 0.7 1.1  PROT 6.7 6.4  ALBUMIN 3.2* 2.8*    Recent Labs  10/02/13 2030 10/03/13 0830  WBC 13.1* 11.4*  NEUTROABS 9.3*  --   HGB 14.2 13.4  HCT 41.6 39.3  MCV 95.0 94.9  PLT 228 200   No results found for this basename: CKTOTAL, CKMB, CKMBINDEX, TROPONINI,  in the last 72 hours  Recent Labs  10/03/13 0830  TSH 0.399   No results found for this basename: VITAMINB12, FOLATE, FERRITIN, TIBC, IRON, RETICCTPCT,  in the last 72 hours Lab Results  Component Value Date   INR 1.0 10/11/2007       Discharge instructions:  03-Skilled Nursing Facility    Disposition: SNF Follow-up Appts: [facility] post D/c from SNF.  Call for appointment.  Condition on Discharge: Stable but weak  Tests Needing Follow-up: None  Time spent in discharge (includes decision making & examination of pt): 30 min  Signed: Damya Comley M 10/04/2013, 3:12 PM

## 2013-10-04 NOTE — Progress Notes (Signed)
Subjective: Admitted c AFib RVR and UTI. HR now controlled and @ 55-65 - still Afib. Working towards rehab for tomorrow. Her elbow has lots of degenerative changes. Continue sling   Objective: Vital signs in last 24 hours: Temp:  [97.7 F (36.5 C)-98.8 F (37.1 C)] 98.8 F (37.1 C) (01/09 0606) Pulse Rate:  [69-90] 90 (01/09 0606) Resp:  [16-17] 17 (01/08 1954) BP: (82-126)/(53-80) 126/76 mmHg (01/09 0606) SpO2:  [94 %-100 %] 96 % (01/09 0606) Weight:  [61.126 kg (134 lb 12.1 oz)] 61.126 kg (134 lb 12.1 oz) (01/09 0606) Weight change: 0.979 kg (2 lb 2.5 oz)    CBG (last 3)   Recent Labs  10/03/13 0143 10/03/13 0759  GLUCAP 150* 109*    Intake/Output from previous day:  Intake/Output Summary (Last 24 hours) at 10/04/13 0739 Last data filed at 10/04/13 0656  Gross per 24 hour  Intake      0 ml  Output    400 ml  Net   -400 ml   01/08 0701 - 01/09 0700 In: -  Out: 400 [Urine:400]   Physical Exam General appearance: Elderly, getting frailer. HOH, poor Dentition  Head: Normocephalic, without obvious abnormality, atraumatic  Neck: no adenopathy, no carotid bruit, no JVD and thyroid not enlarged, symmetric, no tenderness/mass/nodules  Resp: CTA B  Cardio: Irreg Irreg slower GI: soft, non-tender; bowel sounds normal; no masses, no organomegaly  Extremities: extremities normal, atraumatic, no cyanosis or edema  Pulses: 2+ and symmetric  Lymph nodes: no cervical lymphadenopathy  Neurologic: Alert and oriented  L Elbow less tender. Not In a sling this am  Lab Results:  Recent Labs  10/02/13 2030 10/03/13 0830  NA 138 137  K 4.2 4.1  CL 100 103  CO2 23 23  GLUCOSE 116* 103*  BUN 15 12  CREATININE 0.68 0.57  CALCIUM 8.6 8.7  MG  --  2.0     Recent Labs  10/02/13 2030 10/03/13 0830  AST 15 13  ALT 10 8  ALKPHOS 71 63  BILITOT 0.7 1.1  PROT 6.7 6.4  ALBUMIN 3.2* 2.8*     Recent Labs  10/02/13 2030 10/03/13 0830  WBC 13.1* 11.4*  NEUTROABS  9.3*  --   HGB 14.2 13.4  HCT 41.6 39.3  MCV 95.0 94.9  PLT 228 200    Lab Results  Component Value Date   INR 1.0 10/11/2007    No results found for this basename: CKTOTAL, CKMB, CKMBINDEX, TROPONINI,  in the last 72 hours   Recent Labs  10/03/13 0830  TSH 0.399    No results found for this basename: VITAMINB12, FOLATE, FERRITIN, TIBC, IRON, RETICCTPCT,  in the last 72 hours  Micro Results: No results found for this or any previous visit (from the past 240 hour(s)).   Studies/Results: Dg Chest 2 View  10/02/2013   CLINICAL DATA:  Atrial fibrillation  EXAM: CHEST  2 VIEW  COMPARISON:  October 11, 2007  FINDINGS: There is eventration of the right hemidiaphragm, stable. There is no edema or consolidation. Heart size and pulmonary vascularity are normal. No adenopathy. Aorta is prominent and tortuous but stable. There is a hiatal hernia present. There is arthropathy in both shoulders.  IMPRESSION: No edema or consolidation. Stable eventration of the right hemidiaphragm. Aorta is prominent and tortuous with atherosclerotic change. Suspect chronic hypertension. Hiatal hernia present.   Electronically Signed   By: Bretta BangWilliam  Woodruff M.D.   On: 10/02/2013 21:00     Medications: Scheduled: .  aspirin EC  325 mg Oral Daily  . cefTRIAXone (ROCEPHIN)  IV  1 g Intravenous Q24H  . diltiazem  60 mg Oral Q6H  . docusate sodium  100 mg Oral Daily  . enoxaparin (LOVENOX) injection  30 mg Subcutaneous Q24H  . levothyroxine  75 mcg Oral QAC breakfast  . sodium chloride  3 mL Intravenous Q12H   Continuous:    Assessment/Plan: Active Problems:   Atrial fibrillation with RVR   Atrial fibrillation with rapid ventricular response  AFib RVR - She had one dose Cardizem in the ED. Her HR is much better controlled on the Cardizem 60 q 6 orally and will transition to Cardizem CD 180.  240 may be too much and cause too many pauses.  Dig was D/ced last admission. She has been on Cardizem CD 120 prior  to this admission. She may still have Tachy Huston Foley and consideration for possible Pacer was discussed last hospital stay. Will watch for pauses and consider cards consult as needed. We are trying to avoid pacer unless absolutely necessary.  Last Echo: - Left ventricle: Hypokinesis of apical septal segment. The cavity size was normal. Wall thickness was normal. The estimated ejection fraction was 50%. - Aortic valve: Sclerosis without stenosis. - Mitral valve: Mildly calcified annulus. Mild regurgitation. - Left atrium: The atrium was moderately to severely dilated. - Right ventricle: Systolic function was mildly reduced. - Right atrium: The atrium was moderately to severely dilated.  Poor Coumadin candidate.  Off Plavix since Sept and remains on ASA   UTI c Leukocytosis - improving - Cxed up. Rocephin given x 2.  OK to treat/change to oral Abx today.   B Carotid Mild Stenosis 1-39% ICA stenosis. Vertebral artery flow is antegrade. ICA/CCA ratio: R-1.13 L-1.33  AAA - Atherosclerosis only on last Ab [place] 01/2010 - No further w/up needed. Last Ab X Ray shows @ 4 cm AAA - again no further w/up needed.   Known Anxiety - Not on anything at moment.   Abnormality of gait - Walker. PT/OT/CW/SW. She resides at [facility]. Will go to [facility] SNF after hospital stay. Anticipate D/C tomorrow.  Hypothyroid - TSH fine at 0.399  HTN - BP up and down - Will monitor on the increased cardizem  DNR. OOF DNR signed DVT Proph ordered.  L Elbow pain - sling prn and less pain today Constipation - Prns ordered.   DM2 - NTD.  Monitor-  Recent Labs  10/03/13 0143 10/03/13 0759  GLUCAP 150* 109*   ID -  Anti-infectives   Start     Dose/Rate Route Frequency Ordered Stop   10/03/13 2200  cefTRIAXone (ROCEPHIN) 1 g in dextrose 5 % 50 mL IVPB     1 g 100 mL/hr over 30 Minutes Intravenous Every 24 hours 10/03/13 0801     10/02/13 2215  cefTRIAXone (ROCEPHIN) 1 g in dextrose 5 % 50 mL IVPB     1 g 100  mL/hr over 30 Minutes Intravenous  Once 10/02/13 2204 10/02/13 2259       LOS: 2 days   Keala Drum M 10/04/2013, 7:39 AM

## 2013-10-04 NOTE — Progress Notes (Signed)
CSW (Clinical Child psychotherapistocial Worker) has confirmed pt has [facility]. However, if plan is for pt to dc over weekend facility will need dc summary (meds) today. CSW has called MD office to notify and spoke with CNA.  Lon Klippel, LCSWA [phone]

## 2013-10-04 NOTE — Evaluation (Signed)
Occupational Therapy Evaluation Patient Details Name: Alison Singleton MRN: [id number] DOB: 1917-09-09 Today's Date: 10/04/2013 Time: [id number] OT Time Calculation (min): 27 min  OT Assessment / Plan / Recommendation History of present illness pt presents with UTI, A-fib with RVR, and chronic L elbow injury.     Clinical Impression   Pt admitted from [facility] with the above diagnosis and the deficits listed below.  Pt would benefit from cont OT to increase independence with adls back to her baseline level which is fairly I.  Given the pain this pt is in, her cognitive status, her low vision due to macular degeneration and her hearing loss, I do feel this pt may be safer living in an ALF environment for the long term. If this is not available, pt will need SNF for short run but still may need to consider an ALF.    OT Assessment  Patient needs continued OT Services    Follow Up Recommendations  SNF;Supervision/Assistance - 24 hour;Other (comment) (or ALF if available.)    Barriers to Discharge Decreased caregiver support Feel pt may be ready for a move to ALF.  This pt may overall need more S to remain safe.  Equipment Recommendations  None recommended by OT    Recommendations for Other Services    Frequency  Min 2X/week    Precautions / Restrictions Precautions Precautions: Fall Precaution Comments: Sling in room for comfort.   Restrictions Weight Bearing Restrictions: No   Pertinent Vitals/Pain Pt unable to rate pain but reported pain in L elbow although improved from admission per report.    ADL  Eating/Feeding: Performed;Set up;Other (comment) (assist with set up due to decreased vision) Where Assessed - Eating/Feeding: Chair Grooming: Performed;Wash/dry hands;Brushing hair;Min guard;Minimal assistance Where Assessed - Grooming: Supported standing Upper Body Bathing: Simulated;Minimal assistance Where Assessed - Upper Body Bathing: Unsupported sitting Lower Body Bathing:  Simulated;Moderate assistance Where Assessed - Lower Body Bathing: Supported sit to stand Upper Body Dressing: Performed;Maximal assistance Where Assessed - Upper Body Dressing: Supported sitting Lower Body Dressing: Performed;Moderate assistance Where Assessed - Lower Body Dressing: Supported sit to stand Toilet Transfer: Performed;Minimal assistance Toilet Transfer Method: Sit to stand;Other (comment) (ambulated to br) Toilet Transfer Equipment: Comfort height toilet;Grab bars Toileting - Clothing Manipulation and Hygiene: Performed;Supervision/safety Where Assessed - Toileting Clothing Manipulation and Hygiene: Sit on 3-in-1 or toilet Equipment Used: Rolling walker Transfers/Ambulation Related to ADLs: Pt walked in room with rolling walker.  Pt very impulsive with her movements and unsafe with walker mobility.  Do not feel pt is safe to be alone. ADL Comments: Pt struggles with UE adls due to L elbow pain.  Pt unsteady when standing doing adls in standing.    OT Diagnosis: Generalized weakness;Disturbance of vision;Acute pain  OT Problem List: Decreased strength;Decreased range of motion;Impaired balance (sitting and/or standing);Impaired vision/perception;Decreased cognition;Decreased safety awareness;Impaired UE functional use;Pain OT Treatment Interventions: Self-care/ADL training;Therapeutic activities   OT Goals(Current goals can be found in the care plan section) Acute Rehab OT Goals Patient Stated Goal: Feel better OT Goal Formulation: With patient Time For Goal Achievement: 10/18/13 Potential to Achieve Goals: Fair ADL Goals Pt Will Perform Grooming: with supervision;standing Pt Will Perform Upper Body Bathing: with supervision;sitting Pt Will Perform Lower Body Bathing: with supervision;sit to/from stand Pt Will Perform Upper Body Dressing: with min assist;sitting Pt Will Perform Lower Body Dressing: with supervision;sit to/from stand Pt Will Perform Tub/Shower Transfer:  Shower transfer;ambulating;shower seat;rolling walker;with supervision Additional ADL Goal #1: Pt will complete all toileting on  comfort height commode with grab bars and S.  Visit Information  Last OT Received On: 10/04/13 Assistance Needed: +1 History of Present Illness: pt presents with UTI, A-fib with RVR, and chronic L elbow injury.         Prior Functioning     Home Living Family/patient expects to be discharged to:: Private residence Living Arrangements: Alone Available Help at Discharge: Other (Comment);Available PRN/intermittently (daughter checks in on her at [facility]) Type of Home: Independent living facility Home Access: Level entry Home Layout: One level Home Equipment: Walker - 4 wheels;Cane - single point Additional Comments: Pt lives in Independent Living at [facility] and would benefit from move to ALF level.  If not able to move to ALF will need SNF at D/C.   Prior Function Level of Independence: Independent with assistive device(s) Comments: Except pt indicates that for the last 6 weeks she has been getting A for bathing and dressing.   Communication Communication: HOH Dominant Hand: Right         Vision/Perception Vision - History Baseline Vision: Other (comment) Visual History: Macular degeneration Patient Visual Report: No change from baseline;Other (comment) (very limited vision due to macular degeneration) Vision - Assessment Vision Assessment: Vision not tested   Cognition  Cognition Arousal/Alertness: Awake/alert Behavior During Therapy: WFL for tasks assessed/performed Overall Cognitive Status: No family/caregiver present to determine baseline cognitive functioning (pt with some short term memory deficits.) Memory: Decreased short-term memory    Extremity/Trunk Assessment Upper Extremity Assessment Upper Extremity Assessment: LUE deficits/detail LUE Deficits / Details: Pt with shoulder ROM limited to 35 degrees of flexion, elbow has ROM  WFL but very painful although better than on admission. LUE: Unable to fully assess due to pain LUE Coordination: decreased gross motor Lower Extremity Assessment Lower Extremity Assessment: Defer to PT evaluation Cervical / Trunk Assessment Cervical / Trunk Assessment: Kyphotic     Mobility Bed Mobility Overal bed mobility: Modified Independent General bed mobility comments: moves slow due to pain Transfers Overall transfer level: Needs assistance Equipment used: Rolling walker (2 wheeled) Transfers: Sit to/from Stand Sit to Stand: Min guard General transfer comment: pt pushes off of her rolling walker and does not reach back before sitting.  Pt needed cues to stay inside the walker and managing tight spaces was very difficult as pt gets in a hurry and trips over the walker.     Exercise     Balance Balance Overall balance assessment: Needs assistance Sitting-balance support: Feet supported Sitting balance-Leahy Scale: Normal Standing balance support: During functional activity Standing balance-Leahy Scale: Poor Standing balance comment: Pt required walker at all times to attempt to be balanced.   End of Session OT - End of Session Equipment Utilized During Treatment: Rolling walker Activity Tolerance: Patient tolerated treatment well Patient left: in chair;with call bell/phone within reach Nurse Communication: Mobility status  GO     Hope Budds 10/04/2013, 11:47 AM [phone]

## 2013-10-05 LAB — GLUCOSE, CAPILLARY
Glucose-Capillary: 94 mg/dL (ref 70–99)
Glucose-Capillary: 81 mg/dL (ref 70–99)

## 2013-10-05 NOTE — Discharge Summary (Signed)
Physician Discharge Summary  DISCHARGE SUMMARY   Patient ID: Alison Singleton MR#: [id number] DOB/AGE: 78/22/1918 78 y.o.   Attending Physician:Elizaveta Mattice M  Patient's ONG:EXBMW,UXLK M, MD  Consults:  none  Admit date: 10/02/2013 Discharge date: 10/05/2013  Discharge Diagnoses:  Active Problems:   Atrial fibrillation with RVR   Atrial fibrillation with rapid ventricular response   Patient Active Problem List   Diagnosis Date Noted  . Atrial fibrillation with rapid ventricular response 10/03/2013  . Atrial fibrillation with RVR 10/02/2013  . Constipation 08/15/2013  . Depression 07/25/2013  . Osteoporosis 07/25/2013  . Diabetes mellitus 07/25/2013  . Unspecified hypothyroidism 07/16/2013  . Type I (juvenile type) diabetes mellitus with peripheral circulatory disorders, not stated as uncontrolled(250.71) 07/16/2013  . Chronic airway obstruction, not elsewhere classified 07/16/2013  . Atrial fibrillation 06/07/2013   Past Medical History  Diagnosis Date  . Hypertension   . CVA (cerebral infarction)   . Hyperlipidemia   . Hypothyroid   . Diabetes mellitus   . COPD (chronic obstructive pulmonary disease)   . Osteoporosis   . Cataracts, bilateral   . Dysrhythmia     ATRIAL FIB  . Shortness of breath   . Macular degeneration disease     Discharged Condition: good   Discharge Medications:   Medication List         acetaminophen 325 MG tablet  Commonly known as:  TYLENOL  Take 2 tablets (650 mg total) by mouth every 6 (six) hours as needed for mild pain or moderate pain (or Fever >/= 101).     alendronate 70 MG tablet  Commonly known as:  FOSAMAX  Take 70 mg by mouth every 7 (seven) days. Take on Sunday.      Take with a full glass of water on an empty stomach.     aspirin 325 MG EC tablet  Take 1 tablet (325 mg total) by mouth daily.     cephALEXin 500 MG capsule  Commonly known as:  KEFLEX  Take 1 capsule (500 mg total) by mouth every 12 (twelve) hours.      diltiazem 180 MG 24 hr capsule  Commonly known as:  CARDIZEM CD  Take 1 capsule (180 mg total) by mouth daily.     docusate sodium 100 MG capsule  Commonly known as:  COLACE  Take 100 mg by mouth daily.     escitalopram 10 MG tablet  Commonly known as:  LEXAPRO  Take 10 mg by mouth daily.     levothyroxine 75 MCG tablet  Commonly known as:  SYNTHROID, LEVOTHROID  Take 75 mcg by mouth daily before breakfast.        Hospital Procedures: Dg Chest 2 View  10/02/2013   CLINICAL DATA:  Atrial fibrillation  EXAM: CHEST  2 VIEW  COMPARISON:  October 11, 2007  FINDINGS: There is eventration of the right hemidiaphragm, stable. There is no edema or consolidation. Heart size and pulmonary vascularity are normal. No adenopathy. Aorta is prominent and tortuous but stable. There is a hiatal hernia present. There is arthropathy in both shoulders.  IMPRESSION: No edema or consolidation. Stable eventration of the right hemidiaphragm. Aorta is prominent and tortuous with atherosclerotic change. Suspect chronic hypertension. Hiatal hernia present.   Electronically Signed   By: Bretta Bang M.D.   On: 10/02/2013 21:00    History of Present Illness: Admitted through the ED with Afib RVR and a UTI.   Hospital Course: 10 F c known Afib, H/O Osteoporosis, Hypothyroid, prior  CVA, and HTN last admitted 05/2013 who presented to Ortho office ([facility]) 1/7 for her Elbow issues. The elbow pain was considered not new but a flare up from 7 years ago. She fell and had acute on chronic elbow pain. She had negative x-rays for her elbow. Ortho only put a sling on it and said she has severe bone on bone OA. They said it would slowly get better.  She had HR 140-150s - EMS called and sent her to ED. In ED She was AFib RVR and UA confirmed UTI. Rocephin given for UTI. Diltiazem given as well. Her HR was still in 110-140s on arrival to floor. I titrated her oral cardizem to be 60 q 6 hrs.    The following am her HR  was well controlled and @ 55-65 - still Afib.  For her Deconditioning she was Working towards rehab.  Her elbow has lots of degenerative changes.  Continue sling  AFib RVR - She had one dose Cardizem in the ED. Her HR was much better controlled on the Cardizem 60 q 6 orally and she transitioned to Cardizem CD 180. 240 may be too much and cause too many pauses. Dig was D/ced last admission. She has been on Cardizem CD 120 prior to this admission. She may still have Tachy Huston Foley- Brady and consideration for possible Pacer was discussed last hospital stay. Will watch for pauses and consider cards (Dr Elease HashimotoNahser) consult as needed. We are trying to avoid pacer unless absolutely necessary.  Last Echo: - Left ventricle: Hypokinesis of apical septal segment. The cavity size was normal. Wall thickness was normal. The estimated ejection fraction was 50%. - Aortic valve: Sclerosis without stenosis. - Mitral valve: Mildly calcified annulus. Mild regurgitation. - Left atrium: The atrium was moderately to severely dilated. - Right ventricle: Systolic function was mildly reduced. - Right atrium: The atrium was moderately to severely dilated.  Poor Coumadin candidate.  Off Plavix since Sept and remains on ASA   On 10/05/12 her HRs are 70-80 and she is tolerating the increased Cardizem and she is doing well.  UTI c Leukocytosis - improving - Cxed up. Rocephin given x 2.  Will do Keflex until course completed on 10/10/13  B Carotid Mild Stenosis 1-39% ICA stenosis. Vertebral artery flow is antegrade. ICA/CCA ratio: R-1.13 L-1.33  AAA - Atherosclerosis only on last Ab US 01/2010 - No further w/up needed. Last Ab X Ray shows @ 4 cm AAA - again no further w/up needed.   Known Anxiety - Not on anything at moment.   Abnormality of gait - Walker. PT/OT/CW/SW. [facility] resides at [facility]. [facility] hospital stay. Anticipate D/C 10/05/13.   Hypothyroid - TSH fine at 0.399  HTN - BP up and down - Will  monitor on the increased cardizem  DNR. OOF DNR signed  DVT Proph given in hospital.  L Elbow pain - sling prn and less pain today  Constipation - Prns ordered.  DM2 - NTD. Monitor-  Recent Labs   10/03/13 0143  10/03/13 0759   GLUCAP  150*  109*    Seen 10/05/13 and she is doing well for her.  She is HOH.  She is stable and at her baseline.  OK for D/c to SNF today.   Day of Discharge Exam BP 117/64  Pulse 68  Temp(Src) 97.5 F (36.4 C) (Oral)  Resp 18  Ht 5\' 4"  (1.626 m)  Wt 61.78 kg (136 lb 3.2 oz)  BMI 23.37 kg/m2  SpO2 99%  Physical Exam:  General appearance: Elderly, getting frailer. HOH, poor Dentition  Head: Normocephalic, without obvious abnormality, atraumatic  Neck: no adenopathy, no carotid bruit, no JVD and thyroid not enlarged, symmetric, no tenderness/mass/nodules  Resp: CTA B  Cardio: Irreg Irreg but rate controlled GI: soft, non-tender; bowel sounds normal; no masses, no organomegaly  Extremities: extremities normal, atraumatic, no cyanosis or edema  Pulses: 2+ and symmetric  Lymph nodes: no cervical lymphadenopathy  Neurologic: Alert and oriented  L Elbow less tender. In a sling this am   Discharge Labs:  Recent Labs  10/02/13 2030 10/03/13 0830  NA 138 137  K 4.2 4.1  CL 100 103  CO2 23 23  GLUCOSE 116* 103*  BUN 15 12  CREATININE 0.68 0.57  CALCIUM 8.6 8.7  MG  --  2.0    Recent Labs  10/02/13 2030 10/03/13 0830  AST 15 13  ALT 10 8  ALKPHOS 71 63  BILITOT 0.7 1.1  PROT 6.7 6.4  ALBUMIN 3.2* 2.8*    Recent Labs  10/02/13 2030 10/03/13 0830  WBC 13.1* 11.4*  NEUTROABS 9.3*  --   HGB 14.2 13.4  HCT 41.6 39.3  MCV 95.0 94.9  PLT 228 200   No results found for this basename: CKTOTAL, CKMB, CKMBINDEX, TROPONINI,  in the last 72 hours  Recent Labs  10/03/13 0830  TSH 0.399   No results found for this basename: VITAMINB12, FOLATE, FERRITIN, TIBC, IRON, RETICCTPCT,  in the last 72 hours Lab Results  Component Value  Date   INR 1.0 10/11/2007       Discharge instructions:  03-Skilled Nursing Facility Follow-up Information   Follow up with Gwen Pounds, MD In 4 weeks.   Specialty:  Internal Medicine   Contact information:   [address] [facility] [place] [address] [phone]        Disposition: SNF Follow-up Appts: Follow-up with Dr. Timothy Lasso at [facility] in 1-2 weeks post D/c from SNF - anticipate @ 4 weeks from now.  Call for appointment.  Condition on Discharge: Stable but weak  Tests Needing Follow-up: None  Time spent in discharge (includes decision making & examination of pt): 30 min  Signed: Mava Suares M 10/05/2013, 9:42 AM

## 2013-10-05 NOTE — Progress Notes (Signed)
[facility] today via ambulance.    [phone] (weekend CSW)

## 2013-10-09 LAB — CULTURE, BLOOD (ROUTINE X 2)
CULTURE: NO GROWTH
Culture: NO GROWTH

## 2013-10-10 ENCOUNTER — Non-Acute Institutional Stay (SKILLED_NURSING_FACILITY): Payer: Medicare Other | Admitting: Adult Health

## 2013-10-10 DIAGNOSIS — I10 Essential (primary) hypertension: Secondary | ICD-10-CM

## 2013-10-10 DIAGNOSIS — E039 Hypothyroidism, unspecified: Secondary | ICD-10-CM

## 2013-10-10 DIAGNOSIS — F329 Major depressive disorder, single episode, unspecified: Secondary | ICD-10-CM

## 2013-10-10 DIAGNOSIS — F3289 Other specified depressive episodes: Secondary | ICD-10-CM

## 2013-10-10 DIAGNOSIS — I4891 Unspecified atrial fibrillation: Secondary | ICD-10-CM

## 2013-10-10 DIAGNOSIS — F32A Depression, unspecified: Secondary | ICD-10-CM

## 2013-10-10 DIAGNOSIS — K5900 Constipation, unspecified: Secondary | ICD-10-CM

## 2013-10-11 ENCOUNTER — Non-Acute Institutional Stay (SKILLED_NURSING_FACILITY): Payer: Medicare Other | Admitting: Internal Medicine

## 2013-10-11 DIAGNOSIS — M810 Age-related osteoporosis without current pathological fracture: Secondary | ICD-10-CM

## 2013-10-11 DIAGNOSIS — I4891 Unspecified atrial fibrillation: Secondary | ICD-10-CM

## 2013-10-11 DIAGNOSIS — E1059 Type 1 diabetes mellitus with other circulatory complications: Secondary | ICD-10-CM

## 2013-10-11 DIAGNOSIS — E039 Hypothyroidism, unspecified: Secondary | ICD-10-CM

## 2013-10-12 DIAGNOSIS — E1059 Type 1 diabetes mellitus with other circulatory complications: Secondary | ICD-10-CM | POA: Insufficient documentation

## 2013-10-12 NOTE — Progress Notes (Signed)
HISTORY & PHYSICAL  DATE: 10/11/2013   FACILITY: [facility]  LEVEL OF CARE: SNF (31)  ALLERGIES:  Allergies  Allergen Reactions  . Claritin [Loratadine]     CHIEF COMPLAINT:  Manage atrial fibrillation, diabetes mellitus and hypothyroidism  HISTORY OF PRESENT ILLNESS: Patient is a 80 year old Caucasian female who was hospitalized secondary to a-fib with RVR. Patient is admitted to this facility for short-term rehabilitation.  ATRIAL FIBRILLATION: the patients atrial fibrillation remains stable.  The patient denies DOE, tachycardia, orthopnea, transient neurological sx, pedal edema, palpitations, & PNDs.  No complications noted from the medications currently being used.  DM:pt's DM remains stable.  Pt denies polyuria, polydipsia, polyphagia, changes in vision or hypoglycemic episodes.  No complications noted from the medication presently being used.  Last hemoglobin A1c is: Not available.  HYPOTHYROIDISM: The hypothyroidism remains stable. No complications noted from the medications presently being used.  The patient denies fatigue or constipation.  Last TSH 0.399.  PAST MEDICAL HISTORY :  Past Medical History  Diagnosis Date  . Hypertension   . CVA (cerebral infarction)   . Hyperlipidemia   . Hypothyroid   . Diabetes mellitus   . COPD (chronic obstructive pulmonary disease)   . Osteoporosis   . Cataracts, bilateral   . Dysrhythmia     ATRIAL FIB  . Shortness of breath   . Macular degeneration disease     PAST SURGICAL HISTORY: Past Surgical History  Procedure Laterality Date  . Tonsillectomy      SOCIAL HISTORY:  reports that she has quit smoking. She has never used smokeless tobacco. She reports that she does not drink alcohol or use illicit drugs.  FAMILY HISTORY:  Family History  Problem Relation Age of Onset  . Heart disease      No family history    CURRENT MEDICATIONS: Reviewed per [facility]  REVIEW OF SYSTEMS:  See HPI  otherwise 14 point ROS is negative.  PHYSICAL EXAMINATION  VS:  T 97        P 92       RR 18      BP 139/79       POX% 95         GENERAL: no acute distress, normal body habitus EYES: conjunctivae normal, sclerae normal, normal eye lids MOUTH/THROAT: lips without lesions,no lesions in the mouth,tongue is without lesions,uvula elevates in midline NECK: supple, trachea midline, no neck masses, no thyroid tenderness, no thyromegaly LYMPHATICS: no LAN in the neck, no supraclavicular LAN RESPIRATORY: breathing is even & unlabored, BS CTAB CARDIAC: Heart rate is irregularly irregular, no murmur,no extra heart sounds, no edema GI:  ABDOMEN: abdomen soft, normal BS, no masses, no tenderness  LIVER/SPLEEN: no hepatomegaly, no splenomegaly MUSCULOSKELETAL: HEAD: normal to inspection & palpation BACK: no kyphosis, scoliosis or spinal processes tenderness EXTREMITIES: LEFT UPPER EXTREMITY: full range of motion, normal strength & tone RIGHT UPPER EXTREMITY:  full range of motion, normal strength & tone LEFT LOWER EXTREMITY:   range of motion unable to perform due to shoulder pain, normal strength & tone RIGHT LOWER EXTREMITY:  full range of motion, normal strength & tone PSYCHIATRIC: the patient is alert & oriented to person, affect & behavior appropriate  LABS/RADIOLOGY:  Labs reviewed: Basic Metabolic Panel:  Recent Labs  16/10/96 1703 06/09/13 0510 10/02/13 2030 10/03/13 0830  NA 140 136 138 137  K 4.3 3.6 4.2 4.1  CL 103 103 100 103  CO2 26  20 23 23   GLUCOSE 82 79 116* 103*  BUN 15 16 15 12   CREATININE 0.73 0.69 0.68 0.57  CALCIUM 9.7 8.8 8.6 8.7  MG 2.2  --   --  2.0  PHOS 3.7  --   --   --    Liver Function Tests:  Recent Labs  06/07/13 1703 10/02/13 2030 10/03/13 0830  AST 16 15 13   ALT 8 10 8   ALKPHOS 53 71 63  BILITOT 0.6 0.7 1.1  PROT 7.2 6.7 6.4  ALBUMIN 3.6 3.2* 2.8*   CBC:  Recent Labs  06/07/13 1703 06/09/13 0510 10/02/13 2030 10/03/13 0830  WBC  11.4* 8.8 13.1* 11.4*  NEUTROABS 6.5  --  9.3*  --   HGB 15.6* 13.7 14.2 13.4  HCT 44.7 40.8 41.6 39.3  MCV 93.3 93.8 95.0 94.9  PLT 240 192 228 200   Cardiac Enzymes:  Recent Labs  06/07/13 1703 06/07/13 2255 06/08/13 0550  TROPONINI <0.30 <0.30 <0.30   CBG:  Recent Labs  10/03/13 0759 10/05/13 0758 10/05/13 1159  GLUCAP 109* 94 81   ------------------------------------------------------------ Transthoracic Echocardiography  Patient:    Alison Singleton, Timmons MR #:       [id number] Study Date: 06/08/2013 Gender:     F Age:        89 Height:     162.6cm Weight:     59kg BSA:        1.57m^2 Pt. Status: Room:       2W13C    PERFORMING   [place], [facility]     Creola Corn M  SONOGRAPHER  [place], RDCS, CCT cc:  ------------------------------------------------------------ LV EF: 50%  ------------------------------------------------------------ Indications:      Atrial fibrillation - 427.31.  ------------------------------------------------------------ Study Conclusions  - Left ventricle: Hypokinesis of apical septal segment. The   cavity size was normal. Wall thickness was normal. The   estimated ejection fraction was 50%. - Aortic valve: Sclerosis without stenosis. - Mitral valve: Mildly calcified annulus. Mild   regurgitation. - Left atrium: The atrium was moderately to severely   dilated. - Right ventricle: Systolic function was mildly reduced. - Right atrium: The atrium was moderately to severely   dilated. Transthoracic echocardiography.  M-mode, complete 2D, spectral Doppler, and color Doppler.  Height:  Height: 162.6cm. Height: 64in.  Weight:  Weight: 59kg. Weight: 129.7lb.  Body mass index:  BMI: 22.3kg/m^2.  Body surface area:    BSA: 1.85m^2.  Blood pressure:     127/80.  Patient status:  Inpatient.  Location:   Bedside.  ------------------------------------------------------------  ------------------------------------------------------------ Left ventricle:  Hypokinesis of apical septal segment. The cavity size was normal. Wall thickness was normal. The estimated ejection fraction was 50%.  ------------------------------------------------------------ Aortic valve:  Sclerosis without stenosis.  ------------------------------------------------------------ Aorta:  Aortic root: The aortic root was normal in size.  ------------------------------------------------------------ Mitral valve:   Mildly calcified annulus.  Doppler:   Mild regurgitation.  ------------------------------------------------------------ Left atrium:  The atrium was moderately to severely dilated.   ------------------------------------------------------------ Right ventricle:  The cavity size was normal. Systolic function was mildly reduced.  ------------------------------------------------------------ Pulmonic valve:   Poorly visualized.  Doppler:   No significant regurgitation.  ------------------------------------------------------------ Tricuspid valve:   The valve appears to be grossly normal.  Doppler:   Mild regurgitation.  ------------------------------------------------------------ Right atrium:  The atrium was moderately to severely dilated.  ------------------------------------------------------------  2D measurements        Normal  Doppler measurements   Normal Left ventricle  Main pulmonary LVID ED,   37.4 mm     43-52   artery chord,                         Pressure, S    28 mm   =30 PLAX                                             Hg LVID ES,   28.9 mm     23-38   Tricuspid valve chord,                         Regurg peak   238 cm/s ------ PLAX                           vel FS, chord,   23 %      >29     Peak RV-RA     23 mm   ------ PLAX                           gradient, S        Hg LVPW, ED   9.68 mm     ------  Max regurg    238 cm/s ------ IVS/LVPW   0.98        <1.3    vel ratio, ED                      Systemic veins Ventricular septum             Estimated       5 mm   ------ IVS, ED    9.53 mm     ------  CVP               Hg Aorta                          Right ventricle Root diam,   29 mm     ------  Pressure, S    28 mm   <30 ED                                               Hg Left atrium AP dim       55 mm     ------ AP dim     3.37 cm/m^2 <2.2 index Vol, S     83.4 ml     ------ Vol index, 51.2 ml/m^2 ------ S Noninvasive Vascular Lab  Carotid Duplex Study  Patient:    Hessie KnowsCarter, Mazikeen R MR #:       [id number] Study Date: 06/08/2013 Gender:     F Age:        [age] Height: Weight: BSA: Pt. Status: Room:       2W13C    SONOGRAPHER  Sherren Kernsandace Kanady, RVS  ATTENDING    Reeves Forthusso, [name]  ORDERING     [name], [name]  REFERRING    Gwen Poundsusso, [name] Reports also to:  ------------------------------------------------------------ History and indications:  Indications  Carotid artery disease.  History  Diagnostic evaluation.  Risk factors:  Hypertension. Diabetes mellitus. Atrial fibrillation.  ------------------------------------------------------------ Study information:  Study status:  Routine.  Procedure:  A vascular evaluation was performed with the patient in the supine position. Image quality was adequate. The right CCA, right ECA, right ICA, right vertebral, left CCA, left ECA, left ICA, and left vertebral arteries were examined.    Carotid duplex study.   Carotid Duplex exam including 2D imaging, color and spectral Doppler were performed on the extracranial carotid and vertebral arteries using standard established protocols.  Location:  Bedside.  Patient status:  Inpatient.  Arterial flow:  +--------------------+-------+-------+ Location            V sys  V ed    +--------------------+-------+-------+ Right CCA - proximal32cm/s  6cm/s   +--------------------+-------+-------+ Right CCA - distal  36cm/s 10cm/s  +--------------------+-------+-------+ Right ECA           -68cm/s-5cm/s  +--------------------+-------+-------+ Right ICA - proximal41cm/s 11cm/s  +--------------------+-------+-------+ Right ICA - distal  -48cm/s-12cm/s +--------------------+-------+-------+ Right vertebral     28cm/s 9cm/s   +--------------------+-------+-------+ Left CCA - proximal 34cm/s 6cm/s   +--------------------+-------+-------+ Left CCA - distal   54cm/s 7cm/s   +--------------------+-------+-------+ Left ECA            -70cm/s-1cm/s  +--------------------+-------+-------+ Left ICA - proximal 62cm/s 20cm/s  +--------------------+-------+-------+ Left ICA - distal   -45cm/s-17cm/s +--------------------+-------+-------+ Left vertebral      -24cm/s-6cm/s  +--------------------+-------+-------+  ------------------------------------------------------------ Summary: Bilateral: mild mixed plaque origin and proximal ICA with acoustic shadowing. 1-39% ICA stenosis. Vertebral artery flow is antegrade. ICA/CCA ratio: R-1.13 L-1.33  EXAM: CHEST  2 VIEW   COMPARISON:  October 11, 2007   FINDINGS: There is eventration of the right hemidiaphragm, stable. There is no edema or consolidation. Heart size and pulmonary vascularity are normal. No adenopathy. Aorta is prominent and tortuous but stable. There is a hiatal hernia present. There is arthropathy in both shoulders.   IMPRESSION: No edema or consolidation. Stable eventration of the right hemidiaphragm. Aorta is prominent and tortuous with atherosclerotic change. Suspect chronic hypertension. Hiatal hernia present.   ASSESSMENT/PLAN:  Atrial fibrillation-rate controlled. Not a Coumadin candidate. Diabetes mellitus with vascular complications-continue current medications Hypothyroidism-well controlled UTI-completed  Keflex Osteoporosis-continue current medications  I have reviewed patient's medical records received at admission/from hospitalization.  CPT CODE: 16109

## 2013-10-14 DIAGNOSIS — I10 Essential (primary) hypertension: Secondary | ICD-10-CM | POA: Insufficient documentation

## 2013-10-14 NOTE — Progress Notes (Signed)
Patient ID: Alison Singleton, female   DOB: 12-16-1916, 80 y.o.   MRN: [id number]               PROGRESS NOTE  DATE: 10/10/2013  FACILITY: Nursing Home [facility]  LEVEL OF CARE: SNF (31)  Acute Visit  CHIEF COMPLAINT:  Follow-up hospitalization  HISTORY OF PRESENT ILLNESS: This is a 80 year old female who has [facility] with atrial fibrillation with RVR. She has been admitted for short-term rehabilitation.  REASSESSMENT OF ONGOING PROBLEM(S):  HTN: Pt 's HTN remains stable.  Denies CP, sob, DOE, pedal edema, headaches, dizziness or visual disturbances.  No complications from the medications currently being used.  Last BP : 139/79  HYPOTHYROIDISM: The hypothyroidism remains stable. No complications noted from the medications presently being used.  The patient denies fatigue or constipation.  1/15 TSH 0.399  DEPRESSION: The depression remains stable. Patient denies ongoing feelings of sadness, insomnia, anedhonia or lack of appetite. No complications reported from the medications currently being used. Staff do not report behavioral problems.  PAST MEDICAL HISTORY : Reviewed.  No changes.  [facility]  REVIEW OF SYSTEMS:  GENERAL: no change in appetite, no fatigue, no weight changes, no fever, chills or weakness RESPIRATORY: no cough, SOB, DOE, wheezing, hemoptysis CARDIAC: no chest pain, edema or palpitations GI: no abdominal pain, diarrhea, constipation, heart burn, nausea or vomiting  PHYSICAL EXAMINATION  VS:  T 97       P 92      RR 18      BP 139/79     POX 95 %     WT 135 (Lb)  GENERAL: no acute distress, normal body habitus EYES: conjunctivae normal, sclerae normal, normal eye lids NECK: supple, trachea midline, no neck masses, no thyroid tenderness, no thyromegaly LYMPHATICS: no LAN in the neck, no supraclavicular LAN RESPIRATORY: breathing is even & unlabored,  BS CTAB CARDIAC: Irregularly irregular, no murmur,no extra heart sounds, no edema GI: abdomen soft, normal BS, no masses, no tenderness, no hepatomegaly, no splenomegaly PSYCHIATRIC: the patient is alert & oriented to person, affect & behavior appropriate  LABS/RADIOLOGY: Labs reviewed: Basic Metabolic Panel:  Recent Labs  30/86/5708/09/08 1703 06/09/13 0510 10/02/13 2030 10/03/13 0830  NA 140 136 138 137  K 4.3 3.6 4.2 4.1  CL 103 103 100 103  CO2 26 20 23 23   GLUCOSE 82 79 116* 103*  BUN 15 16 15 12   CREATININE 0.73 0.69 0.68 0.57  CALCIUM 9.7 8.8 8.6 8.7  MG 2.2  --   --  2.0  PHOS 3.7  --   --   --    Liver Function Tests:  Recent Labs  06/07/13 1703 10/02/13 2030 10/03/13 0830  AST 16 15 13   ALT 8 10 8   ALKPHOS 53 71 63  BILITOT 0.6 0.7 1.1  PROT 7.2 6.7 6.4  ALBUMIN 3.6 3.2* 2.8*   CBC:  Recent Labs  06/07/13 1703 06/09/13 0510 10/02/13 2030 10/03/13 0830  WBC 11.4* 8.8 13.1* 11.4*  NEUTROABS 6.5  --  9.3*  --   HGB 15.6* 13.7 14.2 13.4  HCT 44.7 40.8 41.6 39.3  MCV 93.3 93.8 95.0 94.9  PLT 240 192 228 200   Cardiac Enzymes:  Recent Labs  06/07/13 1703 06/07/13 2255 06/08/13 0550  TROPONINI <0.30 <0.30 <0.30   BNP: No components found with this basename: POCBNP,  CBG:  Recent Labs  10/03/13 0759 10/05/13 0758 10/05/13 1159  GLUCAP 109* 94 81     ASSESSMENT/PLAN:  Atrial fibrillation with RVR - rate controlled; continue Cardizem  Hypothyroidism - stable; continue Synthroid  Hypertension - well controlled; continue Cardizem  Constipation - no complaints; continue Colace  Depression - stable; continue Lexapro    CPT CODE: 16109

## 2013-11-15 ENCOUNTER — Non-Acute Institutional Stay (SKILLED_NURSING_FACILITY): Payer: Medicare Other | Admitting: Adult Health

## 2013-11-15 ENCOUNTER — Encounter: Payer: Self-pay | Admitting: Adult Health

## 2013-11-15 DIAGNOSIS — I4891 Unspecified atrial fibrillation: Secondary | ICD-10-CM

## 2013-11-15 DIAGNOSIS — F32A Depression, unspecified: Secondary | ICD-10-CM

## 2013-11-15 DIAGNOSIS — K5900 Constipation, unspecified: Secondary | ICD-10-CM

## 2013-11-15 DIAGNOSIS — I10 Essential (primary) hypertension: Secondary | ICD-10-CM

## 2013-11-15 DIAGNOSIS — E039 Hypothyroidism, unspecified: Secondary | ICD-10-CM

## 2013-11-15 DIAGNOSIS — F329 Major depressive disorder, single episode, unspecified: Secondary | ICD-10-CM

## 2013-11-15 DIAGNOSIS — F3289 Other specified depressive episodes: Secondary | ICD-10-CM

## 2013-11-15 NOTE — Progress Notes (Signed)
Patient ID: Alison Singleton, female   DOB: 1917/06/03, 80 y.o.   MRN: [id number]               PROGRESS NOTE  DATE:  11/15/13  FACILITY: Nursing Home Location: [facility]  LEVEL OF CARE: SNF (31)  Acute Visit  CHIEF COMPLAINT:  Discharge Notes  HISTORY OF PRESENT ILLNESS: This is a 80 year old female who is for discharge to senior apartment with Home health PT, OT and Nursing. She has been admitted to [facility] on 10/05/13 from [facility] with atrial fibrillation with RVR. Patient was admitted to this facility for short-term rehabilitation after the patient's recent hospitalization.  Patient has completed SNF rehabilitation and therapy has cleared the patient for discharge.   REASSESSMENT OF ONGOING PROBLEM(S):  HTN: Pt 's HTN remains stable.  Denies CP, sob, DOE, pedal edema, headaches, dizziness or visual disturbances.  No complications from the medications currently being used.  Last BP : 110/76  ATRIAL FIBRILLATION: the patients atrial fibrillation remains stable.  The patient denies DOE, tachycardia, orthopnea, transient neurological sx, pedal edema, palpitations, & PNDs.  No complications noted from the medications currently being used.  HYPOTHYROIDISM: The hypothyroidism remains stable. No complications noted from the medications presently being used.  The patient denies fatigue or constipation.  1/15 TSH 0.399   PAST MEDICAL HISTORY : Reviewed.  No changes.  CURRENT MEDICATIONS: Reviewed per [facility]  REVIEW OF SYSTEMS:  GENERAL: no change in appetite, no fatigue, no weight changes, no fever, chills or weakness RESPIRATORY: no cough, SOB, DOE, wheezing, hemoptysis CARDIAC: no chest pain, edema or palpitations GI: no abdominal pain, diarrhea, constipation, heart burn, nausea or vomiting  PHYSICAL EXAMINATION  GENERAL: no acute distress, normal body habitus NECK: supple, trachea midline, no neck masses, no thyroid tenderness, no  thyromegaly LYMPHATICS: no LAN in the neck, no supraclavicular LAN RESPIRATORY: breathing is even & unlabored, BS CTAB CARDIAC: Irregularly irregular, no murmur,no extra heart sounds, no edema GI: abdomen soft, normal BS, no masses, no tenderness, no hepatomegaly, no splenomegaly PSYCHIATRIC: the patient is alert & oriented to person, affect & behavior appropriate  LABS/RADIOLOGY: Labs reviewed: Basic Metabolic Panel:  Recent Labs  29/52/84 1703 06/09/13 0510 10/02/13 2030 10/03/13 0830  NA 140 136 138 137  K 4.3 3.6 4.2 4.1  CL 103 103 100 103  CO2 26 20 23 23   GLUCOSE 82 79 116* 103*  BUN 15 16 15 12   CREATININE 0.73 0.69 0.68 0.57  CALCIUM 9.7 8.8 8.6 8.7  MG 2.2  --   --  2.0  PHOS 3.7  --   --   --    Liver Function Tests:  Recent Labs  06/07/13 1703 10/02/13 2030 10/03/13 0830  AST 16 15 13   ALT 8 10 8   ALKPHOS 53 71 63  BILITOT 0.6 0.7 1.1  PROT 7.2 6.7 6.4  ALBUMIN 3.6 3.2* 2.8*   CBC:  Recent Labs  06/07/13 1703 06/09/13 0510 10/02/13 2030 10/03/13 0830  WBC 11.4* 8.8 13.1* 11.4*  NEUTROABS 6.5  --  9.3*  --   HGB 15.6* 13.7 14.2 13.4  HCT 44.7 40.8 41.6 39.3  MCV 93.3 93.8 95.0 94.9  PLT 240 192 228 200   Cardiac Enzymes:  Recent Labs  06/07/13 1703 06/07/13 2255 06/08/13 0550  TROPONINI <0.30 <0.30 <0.30    CBG:  Recent Labs  10/03/13 0759 10/05/13 0758 10/05/13 1159  GLUCAP 109* 94 81  ASSESSMENT/PLAN:  Atrial fibrillation with RVR - rate controlled  Hypothyroidism - stable  Hypertension - well controlled  Constipation - no complaints  Depression - stable   I have filled out patient's discharge paperwork and written prescriptions.  Patient will receive home health PT, OT and Nursing.   Total discharge time: Less than 30 minutes Discharge time involved coordination of the discharge process with Child psychotherapistsocial worker, nursing staff and therapy department. Medical justification for home health services  verified.   CPT CODE: [id number]   Ella BodoMonina Vargas  - NP [facility] [phone]

## 2014-05-28 ENCOUNTER — Encounter: Payer: Self-pay | Admitting: Podiatry

## 2014-05-28 ENCOUNTER — Ambulatory Visit (INDEPENDENT_AMBULATORY_CARE_PROVIDER_SITE_OTHER): Payer: Medicare Other | Admitting: Podiatry

## 2014-05-28 VITALS — BP 84/53 | HR 116 | Resp 18

## 2014-05-28 DIAGNOSIS — M79609 Pain in unspecified limb: Secondary | ICD-10-CM

## 2014-05-28 DIAGNOSIS — B351 Tinea unguium: Secondary | ICD-10-CM

## 2014-05-28 DIAGNOSIS — M79676 Pain in unspecified toe(s): Secondary | ICD-10-CM

## 2014-05-28 NOTE — Progress Notes (Signed)
   Subjective:    Patient ID: Alison Singleton, female    DOB: [birth year], 79 y.o.   MRN: [id number]  HPI Patient presents today with her daughter with complaints of painful nails bilaterally. Her daughter states that she regularly gets pedicures however she was told she used to see a podiatrist. Denies any redness or drainage from around the nails no signs of infection. No other complaints at this time. Patient and her daughter state that she is nondiabetic    Review of Systems  Eyes: Positive for visual disturbance.  Musculoskeletal: Positive for back pain and neck pain.  Hematological: Bruises/bleeds easily.  All other systems reviewed and are negative.      Objective:   Physical Exam AAO x3, NAD DP/PT pulses decreased. CRT < 3sec Protective sensation decreased with Simms Weinstein monofilament. Nails hypertrophic, dystrophic, elongated, yellow discoloration, pain with direct palpation of the nail x10. No open skin lesions and no pre-ulcerative lesions. ROM WNL      Assessment & Plan:  80 year old female symptomatically onychomycosis. -.Various treatment options were discussed the patient and her daughter including alternatives, risks, complications. -Nails sharply debrided x10 without complications. -Followup in 3 months or sooner if he palms are to arise or any changes symptoms.

## 2014-08-27 ENCOUNTER — Encounter: Payer: Self-pay | Admitting: Podiatry

## 2014-08-27 ENCOUNTER — Ambulatory Visit (INDEPENDENT_AMBULATORY_CARE_PROVIDER_SITE_OTHER): Payer: Medicare Other | Admitting: Podiatry

## 2014-08-27 VITALS — BP 112/61 | HR 96 | Resp 18

## 2014-08-27 DIAGNOSIS — B351 Tinea unguium: Secondary | ICD-10-CM

## 2014-08-27 DIAGNOSIS — M79676 Pain in unspecified toe(s): Secondary | ICD-10-CM

## 2014-08-27 NOTE — Progress Notes (Signed)
Patient ID: Alison Singleton, female   DOB: [birth year], 80 y.o.   MRN: [id number]  Subjective: 80 year old female returns the office they for complete the bilateral painful second and I onychomycosis. She denies any redness or drainage from the nail sites. Nails are particular painful with shoe gear. No other complaints at this time. Denies any systemic complaints such as fevers, chills, nausea, vomiting. No acute changes since last appointment.   Objective: AAO x3, NAD DP/PT pulses palpable bilaterally, CRT less than 3 seconds Protective sensation intact with Simms Weinstein monofilament, vibratory sensation intact, Achilles tendon reflex intact.  Nails hypertrophic, dystrophic, elongated, brittle, discolored with mild incurvation. There is no surrounding erythema or drainage from the nail sites. No lesions or pre-ulcerative lesions. No interdigital maceration. No pain with calf compression, swelling, warmth, erythema. No areas of pinpoint bony tenderness or pain vibratory sensation.  Assessment: 80 year old female symptomatic onychomycosis  Plan: -Treatment options discussed including alternatives, risks, complications. -Nail sharply debrided 10 without complications. -Discussed the importance daily foot inspection. -Follow-up in 3 months or sooner if any palms are to arise. In the meantime, call the office with any questions, concerns, change in symptoms.

## 2014-09-18 ENCOUNTER — Ambulatory Visit (INDEPENDENT_AMBULATORY_CARE_PROVIDER_SITE_OTHER): Payer: Medicare Other

## 2014-09-18 ENCOUNTER — Ambulatory Visit (INDEPENDENT_AMBULATORY_CARE_PROVIDER_SITE_OTHER): Payer: Medicare Other | Admitting: Family Medicine

## 2014-09-18 VITALS — BP 150/82 | HR 114 | Temp 98.6°F | Resp 22 | Ht 62.0 in | Wt 139.0 lb

## 2014-09-18 DIAGNOSIS — R0781 Pleurodynia: Secondary | ICD-10-CM

## 2014-09-18 DIAGNOSIS — M546 Pain in thoracic spine: Secondary | ICD-10-CM

## 2014-09-18 DIAGNOSIS — R0789 Other chest pain: Secondary | ICD-10-CM

## 2014-09-18 NOTE — Progress Notes (Signed)
Subjective: Patient fell a couple weeks ago and saw her primary care doctor with a fracture rib on the right side. She fell again yesterday has been hurting more in the right rib cage. She hurts some around to the abdomen.  Objective: Chest clear. Heart regular with soft systolic murmur. Abdomen soft without masses, mildly tender in the upper abdomen. She is tender in the mid thoracic spine. Also tender fairly diffusely in the right chest wall.  [facility] reading (PRIMARY) by  Dr. Alwyn RenHopper Possible old fx ~6 th rib Normal T spine (osteoporotic consistent with age)  Assessment: Fall Chest wall contusion and possible reinjury of old fracture Abdominal pain  Plan: Treat symptomatically. If worse take her to the emergency room.

## 2014-09-18 NOTE — Patient Instructions (Signed)
Take your Tylenol 2 tablets 3 times a day when needed for pain  If pain is very severe still go ahead and take one of your other pain pills  Take every fall precaution possibly. Ask the nursing home to do a fall assessment again of her apartment and to check her more frequently  If worse return or go to the emergency room  For now just rest and take it easy

## 2014-09-22 ENCOUNTER — Emergency Department (HOSPITAL_COMMUNITY): Payer: Medicare Other

## 2014-09-22 ENCOUNTER — Encounter (HOSPITAL_COMMUNITY): Payer: Self-pay | Admitting: Emergency Medicine

## 2014-09-22 ENCOUNTER — Inpatient Hospital Stay (HOSPITAL_COMMUNITY)
Admission: EM | Admit: 2014-09-22 | Discharge: 2014-09-24 | DRG: 065 | Disposition: A | Discharge status: Hospice | Payer: Medicare Other | Attending: Internal Medicine | Admitting: Internal Medicine

## 2014-09-22 DIAGNOSIS — Z515 Encounter for palliative care: Secondary | ICD-10-CM

## 2014-09-22 DIAGNOSIS — E785 Hyperlipidemia, unspecified: Secondary | ICD-10-CM | POA: Diagnosis present

## 2014-09-22 DIAGNOSIS — I4891 Unspecified atrial fibrillation: Secondary | ICD-10-CM | POA: Diagnosis present

## 2014-09-22 DIAGNOSIS — I480 Paroxysmal atrial fibrillation: Secondary | ICD-10-CM | POA: Diagnosis present

## 2014-09-22 DIAGNOSIS — N390 Urinary tract infection, site not specified: Secondary | ICD-10-CM | POA: Diagnosis present

## 2014-09-22 DIAGNOSIS — R0600 Dyspnea, unspecified: Secondary | ICD-10-CM | POA: Diagnosis present

## 2014-09-22 DIAGNOSIS — I10 Essential (primary) hypertension: Secondary | ICD-10-CM | POA: Diagnosis present

## 2014-09-22 DIAGNOSIS — E119 Type 2 diabetes mellitus without complications: Secondary | ICD-10-CM | POA: Diagnosis present

## 2014-09-22 DIAGNOSIS — Z9842 Cataract extraction status, left eye: Secondary | ICD-10-CM | POA: Diagnosis not present

## 2014-09-22 DIAGNOSIS — R680 Hypothermia, not associated with low environmental temperature: Secondary | ICD-10-CM | POA: Diagnosis present

## 2014-09-22 DIAGNOSIS — Z66 Do not resuscitate: Secondary | ICD-10-CM | POA: Diagnosis present

## 2014-09-22 DIAGNOSIS — Z79899 Other long term (current) drug therapy: Secondary | ICD-10-CM

## 2014-09-22 DIAGNOSIS — R4182 Altered mental status, unspecified: Secondary | ICD-10-CM

## 2014-09-22 DIAGNOSIS — M6282 Rhabdomyolysis: Secondary | ICD-10-CM | POA: Diagnosis present

## 2014-09-22 DIAGNOSIS — M810 Age-related osteoporosis without current pathological fracture: Secondary | ICD-10-CM | POA: Diagnosis present

## 2014-09-22 DIAGNOSIS — Z8673 Personal history of transient ischemic attack (TIA), and cerebral infarction without residual deficits: Secondary | ICD-10-CM | POA: Diagnosis not present

## 2014-09-22 DIAGNOSIS — E039 Hypothyroidism, unspecified: Secondary | ICD-10-CM | POA: Diagnosis present

## 2014-09-22 DIAGNOSIS — Z7982 Long term (current) use of aspirin: Secondary | ICD-10-CM | POA: Diagnosis not present

## 2014-09-22 DIAGNOSIS — Z9841 Cataract extraction status, right eye: Secondary | ICD-10-CM | POA: Diagnosis not present

## 2014-09-22 DIAGNOSIS — I609 Nontraumatic subarachnoid hemorrhage, unspecified: Secondary | ICD-10-CM | POA: Diagnosis not present

## 2014-09-22 DIAGNOSIS — J449 Chronic obstructive pulmonary disease, unspecified: Secondary | ICD-10-CM | POA: Diagnosis present

## 2014-09-22 DIAGNOSIS — F419 Anxiety disorder, unspecified: Secondary | ICD-10-CM | POA: Diagnosis present

## 2014-09-22 DIAGNOSIS — R0689 Other abnormalities of breathing: Secondary | ICD-10-CM

## 2014-09-22 HISTORY — DX: Cerebral infarction, unspecified: I63.9

## 2014-09-22 LAB — URINALYSIS, ROUTINE W REFLEX MICROSCOPIC
Glucose, UA: NEGATIVE mg/dL
Ketones, ur: 15 mg/dL — AB
Leukocytes, UA: NEGATIVE
Nitrite: NEGATIVE
PROTEIN: 100 mg/dL — AB
Specific Gravity, Urine: 1.031 — ABNORMAL HIGH (ref 1.005–1.030)
UROBILINOGEN UA: 0.2 mg/dL (ref 0.0–1.0)
pH: 6 (ref 5.0–8.0)

## 2014-09-22 LAB — COMPREHENSIVE METABOLIC PANEL
ALK PHOS: 54 U/L (ref 39–117)
ALT: 41 U/L — AB (ref 0–35)
AST: 97 U/L — AB (ref 0–37)
Albumin: 3.6 g/dL (ref 3.5–5.2)
Anion gap: 13 (ref 5–15)
BUN: 28 mg/dL — ABNORMAL HIGH (ref 6–23)
CHLORIDE: 100 meq/L (ref 96–112)
CO2: 20 mmol/L (ref 19–32)
Calcium: 9 mg/dL (ref 8.4–10.5)
Creatinine, Ser: 0.91 mg/dL (ref 0.50–1.10)
GFR, EST AFRICAN AMERICAN: 60 mL/min — AB (ref >90)
GFR, EST NON AFRICAN AMERICAN: 52 mL/min — AB (ref >90)
GLUCOSE: 116 mg/dL — AB (ref 70–99)
POTASSIUM: 4.1 mmol/L (ref 3.5–5.1)
SODIUM: 133 mmol/L — AB (ref 135–145)
Total Bilirubin: 1 mg/dL (ref 0.3–1.2)
Total Protein: 6.9 g/dL (ref 6.0–8.3)

## 2014-09-22 LAB — URINE MICROSCOPIC-ADD ON

## 2014-09-22 LAB — CBC
HCT: 43.6 % (ref 36.0–46.0)
HEMOGLOBIN: 14.9 g/dL (ref 12.0–15.0)
MCH: 31.9 pg (ref 26.0–34.0)
MCHC: 34.2 g/dL (ref 30.0–36.0)
MCV: 93.4 fL (ref 78.0–100.0)
Platelets: 224 10*3/uL (ref 150–400)
RBC: 4.67 MIL/uL (ref 3.87–5.11)
RDW: 14.3 % (ref 11.5–15.5)
WBC: 12.1 10*3/uL — AB (ref 4.0–10.5)

## 2014-09-22 LAB — CBG MONITORING, ED
GLUCOSE-CAPILLARY: 116 mg/dL — AB (ref 70–99)
Glucose-Capillary: 104 mg/dL — ABNORMAL HIGH (ref 70–99)

## 2014-09-22 MED ORDER — SODIUM CHLORIDE 0.9 % IV SOLN
INTRAVENOUS | Status: DC
Start: 2014-09-22 — End: 2014-09-23
  Administered 2014-09-22: 23:00:00 via INTRAVENOUS

## 2014-09-22 MED ORDER — DEXTROSE 5 % IV SOLN
1.0000 g | INTRAVENOUS | Status: DC
  Administered 2014-09-22: 1 g via INTRAVENOUS
  Filled 2014-09-22 (×2): qty 10

## 2014-09-22 MED ORDER — DILTIAZEM HCL 100 MG IV SOLR
5.0000 mg/h | INTRAVENOUS | Status: DC
  Administered 2014-09-23: 5 mg/h via INTRAVENOUS

## 2014-09-22 MED ORDER — LORAZEPAM 2 MG/ML IJ SOLN
1.0000 mg | Freq: Once | INTRAMUSCULAR | Status: AC
  Administered 2014-09-22: 1 mg via INTRAVENOUS

## 2014-09-22 MED ORDER — LORAZEPAM 2 MG/ML IJ SOLN
INTRAMUSCULAR | Status: AC
  Administered 2014-09-22: 1 mg via INTRAVENOUS
  Filled 2014-09-22: qty 1

## 2014-09-22 MED ORDER — LORAZEPAM 2 MG/ML IJ SOLN
1.0000 mg | INTRAMUSCULAR | Status: DC | PRN
  Administered 2014-09-23: 1 mg via INTRAVENOUS
  Filled 2014-09-22: qty 1

## 2014-09-22 MED ORDER — SODIUM CHLORIDE 0.9 % IV BOLUS (SEPSIS)
500.0000 mL | Freq: Once | INTRAVENOUS | Status: AC
  Administered 2014-09-22: 500 mL via INTRAVENOUS

## 2014-09-22 MED ORDER — NICARDIPINE HCL IN NACL 20-0.86 MG/200ML-% IV SOLN
3.0000 mg/h | Freq: Once | INTRAVENOUS | Status: AC
  Administered 2014-09-22: 3 mg/h via INTRAVENOUS
  Filled 2014-09-22: qty 200

## 2014-09-22 NOTE — ED Notes (Signed)
Warm blankets given.

## 2014-09-22 NOTE — H&P (Addendum)
Physician Admission History and Physical     PCP:   Gwen Pounds, MD   Chief Complaint:  Fall , AMS   HPI: Alison Singleton is an 80 y.o. female.  Pt is 80 yo female of Dr [name]'s who presents from [facility]. She was last seen at dinner last night. When her daughter called this AM and noted the line to be busy, she had staff check on her who noted that she was down and altered. She has recent hx of several falls in the past month. Also has hx of treating a UTI last week w/ bactrim . Per the daughter at bedside, they want no heroic measures, she is DNR/DNI and she would like to discuss consideration of comfort care w/ pall care team if she does not recover her mental status. In the ED she is noted to be in a fib w/ RVR in the 100-130s, hypertensive (that was corrected w/ nicardipine ggt), hypothermic and w/ a leukocytosis. U/A is borderline remarkable w/ only occ bacteria. Her CT heads shows subarachnoid bleeding, and Dr Dutch Quint w/ NSG was contacted who appropriately deemed this non-operable.   She is resting comfortably after 1mg  IV ativan, b/c she was combative on arrival. She is on [place]. Story was provided by daughter at bedside  Review of Systems:  Neg except as noted above    Past Medical History (reviewed - no changes required): DM2--diet controlled, L Medial temp occipital infarct and L [place] CVA 09/2007 with 40-60% L Common Carotid artery stenosis, HTN, PSVT/PAF--not coumadin candidate, Hyperlipidemia, Osteoporosis, COPD, Anxiety, Macular degeneration, Dizziness, Fatigue, L Cheek lesion/seb cyst, H/OShingles, Chronic Rhinitis secondary to Allergic rhinitis, LVH , Cervical spur, HOH--AIDES, Hypothyroidism. LE Burning and Pain - ? Neuropathy,  Afib RVR c CVA 05/2013 Admit and Afib RVR and UTI 09/2013 Surgical History (reviewed - no changes required): Ectasia & dilatation of ab aorta B cataracts Family History (reviewed - no changes required): Father deceased- cerebral  hemorrhage. Mother deceased- cancer. Social History (reviewed - no changes required): Widowed, children. Son in late 33s brain damaged in motorcycle accident at 78yo; in [place]. Retired. Spent many yrs [place]. [place], [place]. Complete Medication List: 1) Bactrim Ds 800-160 Mg Oral Tabs (Sulfamethoxazole-trimethoprim) .Marland Kitchen.. 1 po bid 2) Tramadol Hcl 50 Mg Oral Tabs (Tramadol hcl) .Marland Kitchen.. 1 po bid prn 3) Allegra Allergy 60 Mg Oral Tabs (Fexofenadine hcl) .... Prn 4) Tylenol 325 Mg Oral Tabs (Acetaminophen) .Marland Kitchen.. 1 po prn 5) Colace 100 Mg Caps (Docusate sodium) .... One po daily 6) Multivitamins Caps (Multiple vitamin) .... One po daily 7) Aspirin 325 Mg Tab (Aspirin) .... Take one (1) tablet by mouth daily 8) Lexapro 10 Mg Tabs (Escitalopram oxalate) .... One po daily 9) Cardizem Cd 120 Mg Xr24h-cap (Diltiazem hcl coated beads) .... One daily 10) Fosamax 70 Mg Tabs (Alendronate sodium) .Marland Kitchen.. 1 tab po q wk 11) Synthroid 75 Mcg Tabs (Levothyroxine sodium) .Marland Kitchen.. 1 po qd     Physical Exam: Filed Vitals:   09/22/14 2030 09/22/14 2100 09/22/14 2130 09/22/14 2200  BP: 111/70 100/71 93/52 92/56   Pulse:  87 104 97  Temp:      TempSrc:      Resp: 25 28 31 28   SpO2:  95% 94% 93%   Gen: WF in NAD  HEENT: PERRL, MMM  Lungs: CTAB, no w/Singleton Cardio:  No MRG , ir/ir w/ tachy rate  Abd: soft, NT  MSK: unable to assess  Neuro:  Unable to assess  Skin:warm and  dry     Labs on Admission:   Results for Alison Singleton, Alison Singleton (MRN [id number]) as of 09/22/2014 22:28  Ref. Range 10/02/2013 20:30 10/02/2013 21:08 10/02/2013 21:10 10/03/2013 08:30 09/22/2014 15:30  Sodium Latest Range: 135-145 mmol/L 138   137 133 (L)  Potassium Latest Range: 3.5-5.1 mmol/L 4.2   4.1 4.1  Chloride Latest Range: 96-112 mEq/L 100   103 100  CO2 Latest Range: 19-32 mmol/L 23   23 20   BUN Latest Range: 6-23 mg/dL 15   12 28  (H)  Creatinine Latest Range: 0.50-1.10 mg/dL 6.570.68   8.460.57 9.620.91  Calcium Latest Range: 8.4-10.5 mg/dL 8.6   8.7 9.0   GFR calc non Af Amer Latest Range: >90 mL/min 73 (L)   77 (L) 52 (L)  GFR calc Af Amer Latest Range: >90 mL/min 84 (L)   89 (L) 60 (L)  Glucose Latest Range: 70-99 mg/dL 952116 (H)   841103 (H) 324116 (H)  Anion gap Latest Range: 5-15      13  Magnesium Latest Range: 1.5-2.5 mg/dL    2.0   Alkaline Phosphatase Latest Range: 39-117 U/L 71   63 54  Albumin Latest Range: 3.5-5.2 g/dL 3.2 (L)   2.8 (L) 3.6  AST Latest Range: 0-37 U/L 15   13 97 (H)  ALT Latest Range: 0-35 U/L 10   8 41 (H)  Total Protein Latest Range: 6.0-8.3 g/dL 6.7   6.4 6.9  Bilirubin, Direct Latest Range: 0.0-0.3 mg/dL <4.0<0.2      Indirect Bilirubin Latest Range: 0.3-0.9 mg/dL NOT CALCULATED      Total Bilirubin Latest Range: 0.3-1.2 mg/dL 0.7   1.1 1.0  Troponin i, poc Latest Range: 0.00-0.08 ng/mL  0.00     Lactic Acid, Venous Latest Range: 0.5-2.2 mmol/L   1.63    WBC Latest Range: 4.0-10.5 K/uL 13.1 (H)   11.4 (H) 12.1 (H)  RBC Latest Range: 3.87-5.11 MIL/uL 4.38   4.14 4.67  Hemoglobin Latest Range: 12.0-15.0 g/dL 10.214.2   72.513.4 36.614.9  HCT Latest Range: 36.0-46.0 % 41.6   39.3 43.6  MCV Latest Range: 78.0-100.0 fL 95.0   94.9 93.4  MCH Latest Range: 26.0-34.0 pg 32.4   32.4 31.9  MCHC Latest Range: 30.0-36.0 g/dL 44.034.1   34.734.1 42.534.2  RDW Latest Range: 11.5-15.5 % 13.5   13.6 14.3  Platelets Latest Range: 150-400 K/uL 228   200 224  Neutrophils Relative % Latest Range: 43-77 % 72      Lymphocytes Relative Latest Range: 12-46 % 18      Monocytes Relative Latest Range: 3-12 % 10      Eosinophils Relative Latest Range: 0-5 % 0      Basophils Relative Latest Range: 0-1 % 0      NEUT# Latest Range: 1.7-7.7 K/uL 9.3 (H)      Lymphocytes Absolute Latest Range: 0.7-4.0 K/uL 2.3      Monocytes Absolute Latest Range: 0.1-1.0 K/uL 1.4 (H)      Eosinophils Absolute Latest Range: 0.0-0.7 K/uL 0.1      Basophils Absolute Latest Range: 0.0-0.1 K/uL 0.0      TSH Latest Range: 0.350-4.500 uIU/mL    0.399       No results for input(s):  CKTOTAL, CKMB, CKMBINDEX, TROPONINI in the last 72 hours. Lab Results  Component Value Date   INR 1.0 10/11/2007   No results for input(s): TSH, T4TOTAL, T3FREE, THYROIDAB in the last 72 hours.  Invalid input(s): FREET3 No results for input(s): VITAMINB12, FOLATE,  FERRITIN, TIBC, IRON, RETICCTPCT in the last 72 hours.  Radiological Exams on Admission: Dg Ribs Unilateral W/chest Right  09/18/2014   CLINICAL DATA:  Larey Seat yesterday and 2 weeks ago, RIGHT rib and mid thoracic pain, history hypertension, stroke, diabetes, COPD  EXAM: RIGHT RIBS AND CHEST - 3+ VIEW  COMPARISON:  Chest radiographs 10/02/2013  FINDINGS: Upper normal heart size.  Densely calcified and tortuous thoracic aorta.  Mediastinal contours and pulmonary vascularity normal.  Mild chronic elevation of RIGHT diaphragm.  Chronic bronchitic and question emphysematous changes.  No acute infiltrate, pleural effusion, or pneumothorax.  Diffuse osseous demineralization.  BILATERAL glenohumeral degenerative changes.  Chronic calcified RIGHT subcoracoid loose body.  Chronic RIGHT rotator cuff tear.  Rib assessment limited by degree of demineralization and slight motion.  Age-indeterminate fractures of the RIGHT posterior fourth through seventh ribs, favor old though recommend correlation for pain/symptoms at these sites to exclude acute injury.  IMPRESSION: Chronic bronchitic and question emphysematous changes without acute infiltrate.  Osseous demineralization with question old fractures of the posterior RIGHT fourth through seventh ribs as above.   Electronically Signed   By: Ulyses Southward M.D.   On: 09/18/2014 16:04   Dg Thoracic Spine 2 View  09/18/2014   CLINICAL DATA:  80 year old female with right rib and thoracic back pain status post falling yesterday  EXAM: THORACIC SPINE - 2 VIEW  COMPARISON:  Concurrently obtained radiographs of the chest and ribs ; prior chest x-ray 10/02/2013  FINDINGS: No acute fracture or malalignment. Mild dextro  convex and rotary scoliosis of the lumbar spine is incompletely imaged. No lytic of blastic osseous lesion. Atherosclerotic calcifications noted throughout the tortuous thoracic aorta.  IMPRESSION: 1. No evidence of acute fracture or malalignment. 2. Dextro convex and rotary lumbar scoliosis. 3. Aortic atherosclerosis.   Electronically Signed   By: Malachy Moan M.D.   On: 09/18/2014 16:02   Ct Head Wo Contrast  09/22/2014   CLINICAL DATA:  Altered mental status.  EXAM: CT HEAD WITHOUT CONTRAST  TECHNIQUE: Contiguous axial images were obtained from the base of the skull through the vertex without intravenous contrast.  COMPARISON:  Brain CT 10/12/2007  FINDINGS: Examination markedly limited secondary to motion artifact.  Interval development of bilateral low-attenuation subdural collections most suggestive of subdural hygromas or hematomas. Additionally there are high attenuation blood products demonstrated overlying the right and left frontal lobes (image 19; series 21) and layering within the bilateral occipital horns of the lateral ventricles. Possible high attenuation blood products layering along the left tentorium.  Ventricles and sulci are appropriate for patient age. Periventricular and subcortical white matter hypodensity compatible with chronic small vessel ischemic change. Possible focal area of hypoattenuation within the left temporal lobe (image 10; series 201).  The calvarium, mastoid air cells and paranasal sinuses are poorly evaluated due to significant motion artifact.  IMPRESSION: Markedly limited evaluation secondary to motion artifact.  Possible focal area of low-attenuation along the posterior aspect of the left temporal lobe may represent artifact and volume averaging in the setting of significant motion artifact. Ischemic change is not entirely excluded.  High attenuation blood products are demonstrated overlying the right and left frontal lobes, likely representing acute subarachnoid  blood products. Additionally blood products are demonstrated layering within the occipital horns of the lateral ventricles.  There is suggestion of high attenuation blood products layering along the left tentorium versus volume averaging.  Interval development of associated bilateral subdural hygromas versus chronic subdural hematomas.  Critical Value/emergent results were called  by telephone at the time of interpretation on 09/22/2014 at 4:41 pm to Dr. Donnetta HutchingBRIAN COOK , who verbally acknowledged these results.   Electronically Signed   By: Annia Beltrew  Davis M.D.   On: 09/22/2014 16:46   Dg Chest Portable 1 View  09/22/2014   CLINICAL DATA:  Confusion and altered mental status for 1 day; history of COPD, atrial fibrillation, previous CVA, and diabetes  EXAM: PORTABLE CHEST - 1 VIEW  COMPARISON:  Chest x-ray dated September 18, 2014  FINDINGS: The lungs are adequately inflated. There is no focal infiltrate. The cardiac silhouette is top-normal in size. The pulmonary vascularity is not engorged. There is calcification in the aortic arch and descending thoracic aorta. There is no pleural effusion or pneumothorax. There is moderate to severe degenerative change of the shoulders.  IMPRESSION: There is borderline enlargement of the cardiac silhouette. There is no evidence of pneumonia.   Electronically Signed   By: David  Swazil[place]Jordan   On: 09/22/2014 16:49   Orders placed or performed during the hospital encounter of 09/22/14  . ED EKG  . ED EKG  . EKG 12-Lead  . EKG 12-Lead    Assessment/Plan Active Problems:   Subarachnoid hemorrhage   Subarachnoid bleed  Fall w/ subarachnoid hemorrhage - checking CK to ensure no rhabdo, hydrating w/ NS while labs pending. NSG consulted in ED who rec'd observation only. Holding all blood thinners , including ASA 325 . Family would like to discuss GOC w/ pall care in AM, consult placed   ? UTI w/ AMS - completed bactrim as outpt last week. borderline U/A, will start rocephin  emperically and await cx data . Hydrating. Treating w/ abx and IVF to see if any improvement in mental status , as most of AMS is likely 2/2 SA bleed   Agitation - ativan IV prn   A fib w/ RVR - dilt ggt  . placing in tele bed given patient is DNR w/o heroic measures per daughter   Ethics - DNR  PPx - SCDs, no blood thinners    Jaycee Mckellips 09/22/2014, 10:20 PM

## 2014-09-22 NOTE — ED Notes (Signed)
Pt is from a  Facility. Pt feel sometime between last  Night and today. Staff states they saw her last  Night  At dinner. Pt was seen a few days ago for UTI and fall. Per faculty she is normally A&O x4. Pt is AMS on arrival. Pt found in urine with a tooth out next to here.  EMS temp 97.5 neck temp.

## 2014-09-22 NOTE — ED Notes (Signed)
Patient has towel with tape for c-Collar

## 2014-09-22 NOTE — ED Notes (Signed)
MD aware of BP no recommendations at this time.

## 2014-09-22 NOTE — ED Notes (Signed)
Admitting MD paged to [phone]

## 2014-09-22 NOTE — ED Notes (Signed)
Pt SATS went into low 80's PT placed on 2L SATS 96%

## 2014-09-22 NOTE — ED Notes (Signed)
MD at bedside. 

## 2014-09-22 NOTE — ED Notes (Signed)
Called main pharmacy and spoke to maggie to check compatibitily, rocephin and cardizem drip are compatible

## 2014-09-22 NOTE — ED Notes (Signed)
Spoke with Phlebotomy stated she will attempt labs again in about 15 mins

## 2014-09-22 NOTE — ED Notes (Signed)
Pt has some brusing  to the the RT knee and rt foot.

## 2014-09-22 NOTE — ED Notes (Signed)
EMS placed towel around pt neck for support.

## 2014-09-22 NOTE — ED Notes (Signed)
Dr. Adriana Simasook aware of pts bp and states that he will put in orders

## 2014-09-22 NOTE — ED Notes (Signed)
Unable to assess pain 

## 2014-09-22 NOTE — ED Notes (Signed)
Admitting MD paged again  

## 2014-09-22 NOTE — ED Notes (Signed)
Spoke to Dr. Link SnufferHolwerda to clarify medications, currently infusing in cardene, new order for cardizem.  Current bp reading 92/50's. A-fib on cardiac monitor, MD acknowldges, gives verbal order to stop cardene drip and administer cardizem.

## 2014-09-22 NOTE — ED Notes (Addendum)
This rn attempted manual bp for accurate bp. Unable to obtain due to pt movement. MDF aware and meds given. Will reattempt lab draws once pt calms

## 2014-09-23 ENCOUNTER — Encounter (HOSPITAL_COMMUNITY): Payer: Self-pay

## 2014-09-23 DIAGNOSIS — I609 Nontraumatic subarachnoid hemorrhage, unspecified: Principal | ICD-10-CM

## 2014-09-23 DIAGNOSIS — Z515 Encounter for palliative care: Secondary | ICD-10-CM | POA: Diagnosis present

## 2014-09-23 DIAGNOSIS — M6282 Rhabdomyolysis: Secondary | ICD-10-CM | POA: Diagnosis present

## 2014-09-23 DIAGNOSIS — R0689 Other abnormalities of breathing: Secondary | ICD-10-CM

## 2014-09-23 DIAGNOSIS — R0600 Dyspnea, unspecified: Secondary | ICD-10-CM

## 2014-09-23 LAB — CBC
HEMATOCRIT: 40.2 % (ref 36.0–46.0)
HEMOGLOBIN: 13.6 g/dL (ref 12.0–15.0)
MCH: 32.6 pg (ref 26.0–34.0)
MCHC: 33.8 g/dL (ref 30.0–36.0)
MCV: 96.4 fL (ref 78.0–100.0)
Platelets: 206 10*3/uL (ref 150–400)
RBC: 4.17 MIL/uL (ref 3.87–5.11)
RDW: 14.7 % (ref 11.5–15.5)
WBC: 11 10*3/uL — ABNORMAL HIGH (ref 4.0–10.5)

## 2014-09-23 LAB — MRSA PCR SCREENING: MRSA BY PCR: POSITIVE — AB

## 2014-09-23 LAB — CK: Total CK: 715 U/L — ABNORMAL HIGH (ref 7–177)

## 2014-09-23 LAB — GLUCOSE, CAPILLARY: GLUCOSE-CAPILLARY: 97 mg/dL (ref 70–99)

## 2014-09-23 MED ORDER — ONDANSETRON HCL 4 MG/2ML IJ SOLN: 4.0000 mg | Freq: Four times a day (QID) | INTRAMUSCULAR | Status: DC | PRN

## 2014-09-23 MED ORDER — SODIUM CHLORIDE 0.9 % IJ SOLN
3.0000 mL | Freq: Two times a day (BID) | INTRAMUSCULAR | Status: DC
  Administered 2014-09-23 – 2014-09-24 (×3): 3 mL via INTRAVENOUS

## 2014-09-23 MED ORDER — ATROPINE SULFATE 1 % OP SOLN
4.0000 [drp] | OPHTHALMIC | Status: DC | PRN
  Filled 2014-09-23: qty 2

## 2014-09-23 MED ORDER — LEVOTHYROXINE SODIUM 100 MCG IV SOLR
37.5000 ug | Freq: Every day | INTRAVENOUS | Status: DC
  Filled 2014-09-23: qty 5

## 2014-09-23 MED ORDER — CHLORHEXIDINE GLUCONATE CLOTH 2 % EX PADS: 6.0000 | MEDICATED_PAD | Freq: Every day | CUTANEOUS | Status: DC

## 2014-09-23 MED ORDER — MORPHINE SULFATE 2 MG/ML IJ SOLN
2.0000 mg | INTRAMUSCULAR | Status: DC | PRN
Start: 2014-09-23 — End: 2014-09-23

## 2014-09-23 MED ORDER — MORPHINE SULFATE 2 MG/ML IJ SOLN
2.0000 mg | INTRAMUSCULAR | Status: DC | PRN
  Filled 2014-09-23: qty 1

## 2014-09-23 MED ORDER — MORPHINE SULFATE 2 MG/ML IJ SOLN
2.0000 mg | Freq: Four times a day (QID) | INTRAMUSCULAR | Status: DC
  Administered 2014-09-23 – 2014-09-24 (×5): 2 mg via INTRAVENOUS
  Filled 2014-09-23 (×5): qty 1

## 2014-09-23 MED ORDER — ONDANSETRON HCL 4 MG PO TABS: 4.0000 mg | ORAL_TABLET | Freq: Four times a day (QID) | ORAL | Status: DC | PRN

## 2014-09-23 MED ORDER — HYDRALAZINE HCL 20 MG/ML IJ SOLN: 10.0000 mg | Freq: Four times a day (QID) | INTRAMUSCULAR | Status: DC | PRN

## 2014-09-23 MED ORDER — MUPIROCIN 2 % EX OINT
1.0000 "application " | TOPICAL_OINTMENT | Freq: Two times a day (BID) | CUTANEOUS | Status: DC
  Administered 2014-09-23: 1 via NASAL
  Filled 2014-09-23: qty 22

## 2014-09-23 NOTE — Progress Notes (Signed)
Utilization Review Completed.Wendelin Bradt T12/29/2015  

## 2014-09-23 NOTE — ED Notes (Signed)
Called house flow, patient needs step down bed with cardizem drip order.

## 2014-09-23 NOTE — Progress Notes (Signed)
   09/23/14 1500  Clinical Encounter Type  Visited With Patient and family together;Health care provider  Visit Type Initial;Spiritual support;Social support  Referral From Palliative care team  Spiritual Encounters  Spiritual Needs Prayer  Stress Factors  Family Stress Factors Health changes;Loss   Chaplain was referred to patient via spiritual care consult. Chaplain consulted with patient's nurse prior to visit and was notified that the patient's family had decided to switch the patient to comfort care. Patient's daughter was present while chaplain visited. Patient's daughter asked that chaplain pray with the patient. Chaplain prayed with patient and patient's daughter. Patient's daughter is really supportive of the patient and expressed to chaplain that the patient had expressed recently that she is ready to pass on. Patient's daughter explained that she has siblings but one of them lives far away and the other one is in a nursing home facility. Chaplain will continue to provide emotional and spiritual support for patient and patient's family as needed. Kalla Watson, Tommi EmeryBlake R, Chaplain  3:59 PM

## 2014-09-23 NOTE — ED Provider Notes (Addendum)
CSN: [id number]     Arrival date & time 09/22/14  1512 History   First MD Initiated Contact with Patient 09/22/14 1525     Chief Complaint  Patient presents with  . Fall  . Altered Mental Status     (Consider location/radiation/quality/duration/timing/severity/associated sxs/prior Treatment) HPI... Level V caveat for altered mental status.  Patient lives in an assisted living facility. She was found on the floor of the facility, unintended, confused. She was brought to the emergency department by EMS. Daughter reports she is normally relatively hypofunctioning, i.e. able to walk to the dining room with her walker. No prodromal illnesses.  Past Medical History  Diagnosis Date  . Hypertension   . CVA (cerebral infarction)   . Hyperlipidemia   . Hypothyroid   . Diabetes mellitus   . COPD (chronic obstructive pulmonary disease)   . Osteoporosis   . Cataracts, bilateral   . Dysrhythmia     ATRIAL FIB  . Shortness of breath   . Macular degeneration disease    Past Surgical History  Procedure Laterality Date  . Tonsillectomy     Family History  Problem Relation Age of Onset  . Heart disease      No family history   History  Substance Use Topics  . Smoking status: Former Games developer  . Smokeless tobacco: Never Used  . Alcohol Use: No   OB History    No data available     Review of Systems  Unable to perform ROS: Acuity of condition      Allergies  Claritin  Home Medications   Prior to Admission medications   Medication Sig Start Date End Date Taking? Authorizing Provider  acetaminophen (TYLENOL) 325 MG tablet Take 2 tablets (650 mg total) by mouth every 6 (six) hours as needed for mild pain or moderate pain (or Fever >/= 101). 10/04/13  Yes Gwen Pounds, MD  aspirin EC 325 MG EC tablet Take 1 tablet (325 mg total) by mouth daily. 06/10/13  Yes Gwen Pounds, MD  calcium-vitamin D (OSCAL WITH D) 500-200 MG-UNIT per tablet Take 1 tablet by mouth 2 (two) times daily.   Yes  Historical Provider, MD  diltiazem (CARDIZEM CD) 120 MG 24 hr capsule Take 120 mg by mouth daily.   Yes Historical Provider, MD  docusate sodium (COLACE) 100 MG capsule Take 100 mg by mouth daily.   Yes Historical Provider, MD  escitalopram (LEXAPRO) 10 MG tablet Take 10 mg by mouth daily.   Yes Historical Provider, MD  levothyroxine (SYNTHROID, LEVOTHROID) 75 MCG tablet Take 75 mcg by mouth daily before breakfast.   Yes Historical Provider, MD  sulfamethoxazole-trimethoprim (BACTRIM DS,SEPTRA DS) 800-160 MG per tablet Take 1 tablet by mouth 2 (two) times daily. Started on 09-16-14 for 10 day   Yes Historical Provider, MD  diltiazem (CARDIZEM CD) 180 MG 24 hr capsule Take 1 capsule (180 mg total) by mouth daily. 10/04/13   Gwen Pounds, MD  FLUZONE HIGH-DOSE 0.5 ML SUSY  06/17/14   Historical Provider, MD  HYDROcodone-acetaminophen (NORCO/VICODIN) 5-325 MG per tablet Take 1 tablet by mouth every 6 (six) hours as needed for moderate pain.    Historical Provider, MD   BP 100/63 mmHg  Pulse 100  Temp(Src) 96.4 F (35.8 C) (Oral)  Resp 33  SpO2 93% Physical Exam  Constitutional: She is oriented to person, place, and time.  Parched mucous membranes, totally incoherent  HENT:  Head: Normocephalic and atraumatic.  Eyes: Conjunctivae and EOM are  normal. Pupils are equal, round, and reactive to light.  Neck: Normal range of motion. Neck supple.  No meningeal signs  Cardiovascular: Normal rate and regular rhythm.   Pulmonary/Chest: Effort normal and breath sounds normal.  Abdominal: Soft. Bowel sounds are normal.  Musculoskeletal: Normal range of motion.  Neurological: She is alert and oriented to person, place, and time.  Skin: Skin is warm and dry.  Psychiatric: She has a normal mood and affect. Her behavior is normal.  Nursing note and vitals reviewed.   ED Course  Procedures (including critical care time) Labs Review Labs Reviewed  CBC - Abnormal; Notable for the following:    WBC 12.1  (*)    All other components within normal limits  COMPREHENSIVE METABOLIC PANEL - Abnormal; Notable for the following:    Sodium 133 (*)    Glucose, Bld 116 (*)    BUN 28 (*)    AST 97 (*)    ALT 41 (*)    GFR calc non Af Amer 52 (*)    GFR calc Af Amer 60 (*)    All other components within normal limits  URINALYSIS, ROUTINE W REFLEX MICROSCOPIC - Abnormal; Notable for the following:    Color, Urine AMBER (*)    Specific Gravity, Urine 1.031 (*)    Hgb urine dipstick TRACE (*)    Bilirubin Urine MODERATE (*)    Ketones, ur 15 (*)    Protein, ur 100 (*)    All other components within normal limits  URINE MICROSCOPIC-ADD ON - Abnormal; Notable for the following:    Squamous Epithelial / LPF FEW (*)    Bacteria, UA FEW (*)    Casts HYALINE CASTS (*)    All other components within normal limits  CBG MONITORING, ED - Abnormal; Notable for the following:    Glucose-Capillary 116 (*)    All other components within normal limits  CBG MONITORING, ED - Abnormal; Notable for the following:    Glucose-Capillary 104 (*)    All other components within normal limits  CK    Imaging Review Ct Head Wo Contrast  09/22/2014   CLINICAL DATA:  Altered mental status.  EXAM: CT HEAD WITHOUT CONTRAST  TECHNIQUE: Contiguous axial images were obtained from the base of the skull through the vertex without intravenous contrast.  COMPARISON:  Brain CT 10/12/2007  FINDINGS: Examination markedly limited secondary to motion artifact.  Interval development of bilateral low-attenuation subdural collections most suggestive of subdural hygromas or hematomas. Additionally there are high attenuation blood products demonstrated overlying the right and left frontal lobes (image 19; series 21) and layering within the bilateral occipital horns of the lateral ventricles. Possible high attenuation blood products layering along the left tentorium.  Ventricles and sulci are appropriate for patient age. Periventricular and  subcortical white matter hypodensity compatible with chronic small vessel ischemic change. Possible focal area of hypoattenuation within the left temporal lobe (image 10; series 201).  The calvarium, mastoid air cells and paranasal sinuses are poorly evaluated due to significant motion artifact.  IMPRESSION: Markedly limited evaluation secondary to motion artifact.  Possible focal area of low-attenuation along the posterior aspect of the left temporal lobe may represent artifact and volume averaging in the setting of significant motion artifact. Ischemic change is not entirely excluded.  High attenuation blood products are demonstrated overlying the right and left frontal lobes, likely representing acute subarachnoid blood products. Additionally blood products are demonstrated layering within the occipital horns of the lateral ventricles.  There is suggestion of high attenuation blood products layering along the left tentorium versus volume averaging.  Interval development of associated bilateral subdural hygromas versus chronic subdural hematomas.  Critical Value/emergent results were called by telephone at the time of interpretation on 09/22/2014 at 4:41 pm to Dr. Donnetta HutchingBRIAN Lovey Crupi , who verbally acknowledged these results.   Electronically Signed   By: Annia Beltrew  Davis M.D.   On: 09/22/2014 16:46   Dg Chest Portable 1 View  09/22/2014   CLINICAL DATA:  Confusion and altered mental status for 1 day; history of COPD, atrial fibrillation, previous CVA, and diabetes  EXAM: PORTABLE CHEST - 1 VIEW  COMPARISON:  Chest x-ray dated September 18, 2014  FINDINGS: The lungs are adequately inflated. There is no focal infiltrate. The cardiac silhouette is top-normal in size. The pulmonary vascularity is not engorged. There is calcification in the aortic arch and descending thoracic aorta. There is no pleural effusion or pneumothorax. There is moderate to severe degenerative change of the shoulders.  IMPRESSION: There is borderline  enlargement of the cardiac silhouette. There is no evidence of pneumonia.   Electronically Signed   By: David  [place]az[place]   On: 09/22/2014 16:49     EKG Interpretation   Date/Time:  Monday September 22 2014 16:28:46 EST Ventricular Rate:  134 PR Interval:    QRS Duration: 84 QT Interval:  345 QTC Calculation: 515 R Axis:   104 Text Interpretation:  Atrial fibrillation with rapid V-rate Ventricular  premature complex Right axis deviation Minimal ST depression, inferior  leads Borderline T abnormalities, inferior leads Confirmed by Brihana Quickel  MD,  Laurelin Elson ([id number]) on 09/22/2014 5:07:37 PM     CRITICAL CARE Performed by: Donnetta HutchingOOK,Aolanis Crispen Total critical care time: 30 Critical care time was exclusive of separately billable procedures and treating other patients. Critical care was necessary to treat or prevent imminent or life-threatening deterioration. Critical care was time spent personally by me on the following activities: development of treatment plan with patient and/or surrogate as well as nursing, discussions with consultants, evaluation of patient's response to treatment, examination of patient, obtaining history from patient or surrogate, ordering and performing treatments and interventions, ordering and review of laboratory studies, ordering and review of radiographic studies, pulse oximetry and re-evaluation of patient's condition. MDM   Final diagnoses:  Altered mental state   CT head reveals a subarachnoid hemorrhage. Discussed with neurosurgeon Dr. Dutch QuintPoole.  He recommended no acute intervention. Cardene drip started to control blood pressure. Admit to Dr. Gwenith SpitzHolwerda    Harjit Douds, MD 09/23/14 0110  Donnetta HutchingBrian Inari Shin, MD 09/23/14 [phone]

## 2014-09-23 NOTE — Progress Notes (Signed)
Called and gave report to RN, Enid Derryarole. Daughter remains at bedside with pt. Pt safely transported with RN. RN Enid Derryarole notified of pt's arrival.

## 2014-09-23 NOTE — Progress Notes (Signed)
SLP Cancellation Note  Patient Details Name: Alison Singleton MRN: [id number] DOB: 03/16/1917   Cancelled treatment:       Reason Eval/Treat Not Completed: Other (comment). Comfort measures only. SLP will sign off.    Isayah Ignasiak, Riley NearingBonnie Caroline 09/23/2014, 9:07 AM

## 2014-09-23 NOTE — ED Notes (Signed)
Change in bed status, patient now going to stepdown.

## 2014-09-23 NOTE — Progress Notes (Signed)
Subjective: Patient remains unresponsive.  No seizure activity.  Respirations are normal- no labored breathing.  Daughter at bedside indicates that she does not desire life-sustaining measures and wants comfort care.  She has indicated that she would not want ALF or SNF and has clearly indicated desire for death in the future.  Objective: Vital signs in last 24 hours: Temp:  [96.4 F (35.8 C)-99.3 F (37.4 C)] 99.1 F (37.3 C) (12/29 0400) Pulse Rate:  [65-149] 76 (12/29 0745) Resp:  [14-35] 24 (12/29 0745) BP: (92-226)/(50-198) 96/53 mmHg (12/29 0745) SpO2:  [68 %-98 %] 98 % (12/29 0745) Weight:  [61.7 kg (136 lb 0.4 oz)] 61.7 kg (136 lb 0.4 oz) (12/29 0100) Weight change:  Last BM Date: 09/21/14  CBG (last 3)   Recent Labs  09/22/14 1558 09/22/14 1632 09/23/14 0126  GLUCAP 116* 104* 97    Intake/Output from previous day: 12/28 0701 - 12/29 0700 In: 960 [I.V.:960] Out: -  Intake/Output this shift: Total I/O In: 130 [I.V.:130] Out: -   General appearance: unresponsive, no labored breathing Eyes: no scleral icterus Throat: oropharynx moist without erythema Resp: clear to auscultation bilaterally Cardio: irregularly irregular rhythm GI: soft, non-tender; bowel sounds normal; no masses,  no organomegaly Extremities: no clubbing, cyanosis or edema   Lab Results:  Recent Labs  09/22/14 1530  NA 133*  K 4.1  CL 100  CO2 20  GLUCOSE 116*  BUN 28*  CREATININE 0.91  CALCIUM 9.0    Recent Labs  09/22/14 1530  AST 97*  ALT 41*  ALKPHOS 54  BILITOT 1.0  PROT 6.9  ALBUMIN 3.6    Recent Labs  09/22/14 1530  WBC 12.1*  HGB 14.9  HCT 43.6  MCV 93.4  PLT 224   Lab Results  Component Value Date   INR 1.0 10/11/2007    Recent Labs  09/23/14 0357  CKTOTAL 715*    Studies/Results: Ct Head Wo Contrast  09/22/2014   CLINICAL DATA:  Altered mental status.  EXAM: CT HEAD WITHOUT CONTRAST  TECHNIQUE: Contiguous axial images were obtained from the  base of the skull through the vertex without intravenous contrast.  COMPARISON:  Brain CT 10/12/2007  FINDINGS: Examination markedly limited secondary to motion artifact.  Interval development of bilateral low-attenuation subdural collections most suggestive of subdural hygromas or hematomas. Additionally there are high attenuation blood products demonstrated overlying the right and left frontal lobes (image 19; series 21) and layering within the bilateral occipital horns of the lateral ventricles. Possible high attenuation blood products layering along the left tentorium.  Ventricles and sulci are appropriate for patient age. Periventricular and subcortical white matter hypodensity compatible with chronic small vessel ischemic change. Possible focal area of hypoattenuation within the left temporal lobe (image 10; series 201).  The calvarium, mastoid air cells and paranasal sinuses are poorly evaluated due to significant motion artifact.  IMPRESSION: Markedly limited evaluation secondary to motion artifact.  Possible focal area of low-attenuation along the posterior aspect of the left temporal lobe may represent artifact and volume averaging in the setting of significant motion artifact. Ischemic change is not entirely excluded.  High attenuation blood products are demonstrated overlying the right and left frontal lobes, likely representing acute subarachnoid blood products. Additionally blood products are demonstrated layering within the occipital horns of the lateral ventricles.  There is suggestion of high attenuation blood products layering along the left tentorium versus volume averaging.  Interval development of associated bilateral subdural hygromas versus chronic subdural hematomas.  Critical  Value/emergent results were called by telephone at the time of interpretation on 09/22/2014 at 4:41 pm to Dr. Donnetta HutchingBRIAN COOK , who verbally acknowledged these results.   Electronically Signed   By: Annia Beltrew  Davis M.D.   On:  09/22/2014 16:46   Dg Chest Portable 1 View  09/22/2014   CLINICAL DATA:  Confusion and altered mental status for 1 day; history of COPD, atrial fibrillation, previous CVA, and diabetes  EXAM: PORTABLE CHEST - 1 VIEW  COMPARISON:  Chest x-ray dated September 18, 2014  FINDINGS: The lungs are adequately inflated. There is no focal infiltrate. The cardiac silhouette is top-normal in size. The pulmonary vascularity is not engorged. There is calcification in the aortic arch and descending thoracic aorta. There is no pleural effusion or pneumothorax. There is moderate to severe degenerative change of the shoulders.  IMPRESSION: There is borderline enlargement of the cardiac silhouette. There is no evidence of pneumonia.   Electronically Signed   By: David  [place]   On: 09/22/2014 16:49     Medications: Scheduled: . cefTRIAXone (ROCEPHIN)  IV  1 g Intravenous Q24H  . Chlorhexidine Gluconate Cloth  6 each Topical Q0600  . levothyroxine  37.5 mcg Intravenous Daily  . mupirocin ointment  1 application Nasal BID  . sodium chloride  3 mL Intravenous Q12H   Continuous: . sodium chloride 125 mL/hr at 09/22/14 2300  . diltiazem (CARDIZEM) infusion 5 mg/hr (09/23/14 0038)    Assessment/Plan: Principal Problem: 1. Subarachnoid hemorrhage- no significant improvement in neurologic status.  Nonoperative management is appropriate.  Continue supportive care.  Daughter indicates desire to discontinue all treatments except comfort measures.    Active Problems: 2, Atrial fibrillation with rapid ventricular response- currently rate controlled on Diltiazem drip.  Will discontinue per daughter's wishes and discontinue telemetry. 3. Hypertension- controlled. 4. Rhabdomyolysis- CK moderately elevated consistent with mild Rhabdomyolysis secondary to prolonged time down after fall. 5. Ethics- will transition to end-of-life comfort measures and use MSO4 as needed for pain or respiratory distress.  Discontinue IV fluids.   Transfer to floor.  Palliative care consult in place to assist with end-of-life care and disposition options (?[facility] vs. In-hospital death)   LOS: 1 day   Martha ClanShaw, Denisia Harpole 09/23/2014, 8:17 AM

## 2014-09-23 NOTE — ED Notes (Signed)
Spoke with Dr. Link SnufferHolwerda in regards to cardizem drip order and current bed 4N not accepting based on cardizem drip and head bleed.  Drip not started yet due to bp in the 90's and heart 100-105's with a-fib on the monitor after cardene stopped.  MD acknowledges, states that drip is not titratable, but continuous for heart rate management. MD states he "only wants to be notified for emergent calls", and states 4N is appropriate placement at this time. Updated charge RN on patient case, and placement is discussed with Dr. Link SnufferHolwerda.

## 2014-09-23 NOTE — Consult Note (Signed)
Patient ZO:XWRU:Alison Singleton      DOB: 07-11-1917      EAV:[id number]     Consult Note from the Palliative Medicine Team at [facility]    Consult Requested by: Dr Link SnufferHolwerda    PCP: Gwen PoundsUSSO,[name], MD Reason for Consultation:GOC    Phone Number:[phone]  Assessment/Recommendations: 80 yo female with PMhx of DM, HTN, CVA who presented from ALF where she was found down. Here CT noted for ICH, ? UTI.  Decision made this morning to transition to comfort care  1.  Code Status: DNR  2.  Goals of Care: Comfort.  Family agrees to look into residential hospice facility.  I have placed consult to SW to evaluate.  I do not think she meets GIP criteria given only very basic symptom management needs.   3. Symptom Management:   1. Dyspnea/Tachypnea: RR in 30's, will go ahead and schedule low dose morphine. PRN ativan less effective for this.  Suspect will not be difficult to control.  2. Pain: PRN morphine 3. Terminal Secretions: not significant issues, keep HOB elevated. I will order PRN atropine drops  4. Psychosocial/Spiritual: Daughter at bedside. Requests Chaplain support which I have ordered      Brief HPI: 80 yo female with PMHx of DM, HTN, CVA's who presented from ALF yesterday where she was found down. Subsequently noted to have scattered SAH's, UTI.  Transitioned to comfort care this morning. Patient minimally responsive and unable to provide any history. Abx stopped and only comfort meds to be given.       PMH:  Past Medical History  Diagnosis Date  . Hypertension   . CVA (cerebral infarction)   . Hyperlipidemia   . Hypothyroid   . Diabetes mellitus   . COPD (chronic obstructive pulmonary disease)   . Osteoporosis   . Cataracts, bilateral   . Dysrhythmia     ATRIAL FIB  . Shortness of breath   . Macular degeneration disease   . Stroke      PSH: Past Surgical History  Procedure Laterality Date  . Tonsillectomy     I have reviewed the FH and SH and  If appropriate  update it with new information. Allergies  Allergen Reactions  . Claritin [Loratadine]     unknown   Scheduled Meds: . Chlorhexidine Gluconate Cloth  6 each Topical Q0600  .  morphine injection  2 mg Intravenous Q6H  . mupirocin ointment  1 application Nasal BID  . sodium chloride  3 mL Intravenous Q12H   Continuous Infusions:  PRN Meds:.LORazepam, morphine injection, ondansetron **OR** ondansetron (ZOFRAN) IV    BP 96/53 mmHg  Pulse 76  Temp(Src) 97.4 F (36.3 C) (Axillary)  Resp 24  Ht 5\' 2"  (1.575 m)  Wt 61.7 kg (136 lb 0.4 oz)  BMI 24.87 kg/m2  SpO2 98%   PPS: 10   Intake/Output Summary (Last 24 hours) at 09/23/14 1053 Last data filed at 09/23/14 0900  Gross per 24 hour  Intake   1220 ml  Output      0 ml  Net   1220 ml    Physical Exam:  General: Obtunded, tachypnea Chest:   scattered coarse sounds CVS: RRR Abdomen: soft Ext:  warm   Labs: CBC    Component Value Date/Time   WBC 11.0* 09/23/2014 0750   RBC 4.17 09/23/2014 0750   HGB 13.6 09/23/2014 0750   HCT 40.2 09/23/2014 0750   PLT 206 09/23/2014 0750   MCV 96.4 09/23/2014 0750  MCH 32.6 09/23/2014 0750   MCHC 33.8 09/23/2014 0750   RDW 14.7 09/23/2014 0750   LYMPHSABS 2.3 10/02/2013 2030   MONOABS 1.4* 10/02/2013 2030   EOSABS 0.1 10/02/2013 2030   BASOSABS 0.0 10/02/2013 2030    BMET    Component Value Date/Time   NA 133* 09/22/2014 1530   K 4.1 09/22/2014 1530   CL 100 09/22/2014 1530   CO2 20 09/22/2014 1530   GLUCOSE 116* 09/22/2014 1530   BUN 28* 09/22/2014 1530   CREATININE 0.91 09/22/2014 1530   CALCIUM 9.0 09/22/2014 1530   GFRNONAA 52* 09/22/2014 1530   GFRAA 60* 09/22/2014 1530    CMP     Component Value Date/Time   NA 133* 09/22/2014 1530   K 4.1 09/22/2014 1530   CL 100 09/22/2014 1530   CO2 20 09/22/2014 1530   GLUCOSE 116* 09/22/2014 1530   BUN 28* 09/22/2014 1530   CREATININE 0.91 09/22/2014 1530   CALCIUM 9.0 09/22/2014 1530   PROT 6.9 09/22/2014  1530   ALBUMIN 3.6 09/22/2014 1530   AST 97* 09/22/2014 1530   ALT 41* 09/22/2014 1530   ALKPHOS 54 09/22/2014 1530   BILITOT 1.0 09/22/2014 1530   GFRNONAA 52* 09/22/2014 1530   GFRAA 60* 09/22/2014 1530   12/28 CT Head IMPRESSION: Markedly limited evaluation secondary to motion artifact.  Possible focal area of low-attenuation along the posterior aspect of the left temporal lobe may represent artifact and volume averaging in the setting of significant motion artifact. Ischemic change is not entirely excluded.  High attenuation blood products are demonstrated overlying the right and left frontal lobes, likely representing acute subarachnoid blood products. Additionally blood products are demonstrated layering within the occipital horns of the lateral ventricles.  There is suggestion of high attenuation blood products layering along the left tentorium versus volume averaging.  Interval development of associated bilateral subdural hygromas versus chronic subdural hematomas.  Total Time: 45 minutes Greater than 50%  of this time was spent counseling and coordinating care related to the above assessment and plan.  Orvis BrillAaron J. Malijah Lietz D.O. Palliative Medicine Team at [facility]  Pager: [phone] Team Phone: [phone]

## 2014-09-23 NOTE — ED Notes (Signed)
Admitting MD paged 

## 2014-09-23 NOTE — Progress Notes (Signed)
Pt admitted to 2C12. Pt currently 12000ml/hr NS and Cardizem 835ml/hr. Pt has no skin issues. Pt is non verbal and not following commands. Pts daughter is at bedside and stated that she lives at [facility] and primary MD is Dr Timothy Lassousso. Daughter stated that pt is independent with a walker but has has 3 falls in the last two weeks.

## 2014-09-24 MED ORDER — LORAZEPAM 1 MG PO TABS: 1.0000 mg | ORAL_TABLET | Freq: Four times a day (QID) | ORAL | Status: AC | PRN

## 2014-09-24 MED ORDER — MORPHINE SULFATE 2 MG/ML IJ SOLN: 2.0000 mg | INTRAMUSCULAR | Status: AC | PRN

## 2014-09-24 MED ORDER — ATROPINE SULFATE 1 % OP SOLN: 4.0000 [drp] | OPHTHALMIC | Status: AC | PRN

## 2014-09-24 MED ORDER — MORPHINE SULFATE (CONCENTRATE) 10 MG/0.5ML PO SOLN: 5.0000 mg | ORAL | Status: DC | PRN

## 2014-09-24 MED ORDER — ONDANSETRON HCL 4 MG PO TABS: 4.0000 mg | ORAL_TABLET | Freq: Four times a day (QID) | ORAL | Status: AC | PRN

## 2014-09-24 MED ORDER — MORPHINE SULFATE 10 MG/5ML PO SOLN: 5.0000 mg | ORAL | Status: AC | PRN

## 2014-09-24 NOTE — Discharge Summary (Signed)
DISCHARGE SUMMARY  Alison Singleton  MR#: [id number]  DOB:September 19, 1917  Date of Admission: 09/22/2014 Date of Discharge: 09/24/2014  Attending Physician:Singleton, Alison NoaWilliam  Patient's WUJ:WJXBJ,YNWGPCP:Alison M, MD  Consults: Palliative Care Medicine  Discharge Diagnoses: Principal Problem:   Subarachnoid hemorrhage Active Problems:   Atrial fibrillation with rapid ventricular response   Hypertension   Rhabdomyolysis   Dyspnea and respiratory abnormality   Palliative care encounter  Past Medical History  Diagnosis Date  . Hypertension   . CVA (cerebral infarction)   . Hyperlipidemia   . Hypothyroid   . Diabetes mellitus   . COPD (chronic obstructive pulmonary disease)   . Osteoporosis   . Cataracts, bilateral   . Dysrhythmia     ATRIAL FIB  . Shortness of breath   . Macular degeneration disease   . Stroke    Past Surgical History  Procedure Laterality Date  . Tonsillectomy      Discharge Medications:   Medication List    STOP taking these medications        acetaminophen 325 MG tablet  Commonly known as:  TYLENOL     aspirin 325 MG EC tablet     calcium-vitamin D 500-200 MG-UNIT per tablet  Commonly known as:  OSCAL WITH D     diltiazem 120 MG 24 hr capsule  Commonly known as:  CARDIZEM CD     diltiazem 180 MG 24 hr capsule  Commonly known as:  CARDIZEM CD     escitalopram 10 MG tablet  Commonly known as:  LEXAPRO     FLUZONE HIGH-DOSE 0.5 ML Susy  Generic drug:  Influenza Vac Split High-Dose     HYDROcodone-acetaminophen 5-325 MG per tablet  Commonly known as:  NORCO/VICODIN     levothyroxine 75 MCG tablet  Commonly known as:  SYNTHROID, LEVOTHROID     sulfamethoxazole-trimethoprim 800-160 MG per tablet  Commonly known as:  BACTRIM DS,SEPTRA DS      TAKE these medications        atropine 1 % ophthalmic solution  Place 4 drops under the tongue every 4 (four) hours as needed (secretions).     docusate sodium 100 MG capsule  Commonly known as:  COLACE   Take 100 mg by mouth daily.     LORazepam 1 MG tablet  Commonly known as:  ATIVAN  Take 1 tablet (1 mg total) by mouth every 6 (six) hours as needed for anxiety.     morphine 10 MG/5ML solution  Take 2.5 mLs (5 mg total) by mouth every hour as needed for moderate pain or severe pain (respiratory distress).     morphine 2 MG/ML injection  Inject 1 mL (2 mg total) into the vein every hour as needed (respiratory distress).     ondansetron 4 MG tablet  Commonly known as:  ZOFRAN  Take 1 tablet (4 mg total) by mouth every 6 (six) hours as needed for nausea.        Hospital Procedures: Dg Ribs Unilateral W/chest Right  09/18/2014   CLINICAL DATA:  Larey SeatFell yesterday and 2 weeks ago, RIGHT rib and mid thoracic pain, history hypertension, stroke, diabetes, COPD  EXAM: RIGHT RIBS AND CHEST - 3+ VIEW  COMPARISON:  Chest radiographs 10/02/2013  FINDINGS: Upper normal heart size.  Densely calcified and tortuous thoracic aorta.  Mediastinal contours and pulmonary vascularity normal.  Mild chronic elevation of RIGHT diaphragm.  Chronic bronchitic and question emphysematous changes.  No acute infiltrate, pleural effusion, or pneumothorax.  Diffuse osseous demineralization.  BILATERAL glenohumeral degenerative changes.  Chronic calcified RIGHT subcoracoid loose body.  Chronic RIGHT rotator cuff tear.  Rib assessment limited by degree of demineralization and slight motion.  Age-indeterminate fractures of the RIGHT posterior fourth through seventh ribs, favor old though recommend correlation for pain/symptoms at these sites to exclude acute injury.  IMPRESSION: Chronic bronchitic and question emphysematous changes without acute infiltrate.  Osseous demineralization with question old fractures of the posterior RIGHT fourth through seventh ribs as above.   Electronically Signed   By: Ulyses Southward Singleton.D.   On: 09/18/2014 16:04   Dg Thoracic Spine 2 View  09/18/2014   CLINICAL DATA:  80 year old female with right rib  and thoracic back pain status post falling yesterday  EXAM: THORACIC SPINE - 2 VIEW  COMPARISON:  Concurrently obtained radiographs of the chest and ribs ; prior chest x-ray 10/02/2013  FINDINGS: No acute fracture or malalignment. Mild dextro convex and rotary scoliosis of the lumbar spine is incompletely imaged. No lytic of blastic osseous lesion. Atherosclerotic calcifications noted throughout the tortuous thoracic aorta.  IMPRESSION: 1. No evidence of acute fracture or malalignment. 2. Dextro convex and rotary lumbar scoliosis. 3. Aortic atherosclerosis.   Electronically Signed   By: Malachy Moan Singleton.D.   On: 09/18/2014 16:02   Ct Head Wo Contrast  09/22/2014   CLINICAL DATA:  Altered mental status.  EXAM: CT HEAD WITHOUT CONTRAST  TECHNIQUE: Contiguous axial images were obtained from the base of the skull through the vertex without intravenous contrast.  COMPARISON:  Brain CT 10/12/2007  FINDINGS: Examination markedly limited secondary to motion artifact.  Interval development of bilateral low-attenuation subdural collections most suggestive of subdural hygromas or hematomas. Additionally there are high attenuation blood products demonstrated overlying the right and left frontal lobes (image 19; series 21) and layering within the bilateral occipital horns of the lateral ventricles. Possible high attenuation blood products layering along the left tentorium.  Ventricles and sulci are appropriate for patient age. Periventricular and subcortical white matter hypodensity compatible with chronic small vessel ischemic change. Possible focal area of hypoattenuation within the left temporal lobe (image 10; series 201).  The calvarium, mastoid air cells and paranasal sinuses are poorly evaluated due to significant motion artifact.  IMPRESSION: Markedly limited evaluation secondary to motion artifact.  Possible focal area of low-attenuation along the posterior aspect of the left temporal lobe may represent artifact  and volume averaging in the setting of significant motion artifact. Ischemic change is not entirely excluded.  High attenuation blood products are demonstrated overlying the right and left frontal lobes, likely representing acute subarachnoid blood products. Additionally blood products are demonstrated layering within the occipital horns of the lateral ventricles.  There is suggestion of high attenuation blood products layering along the left tentorium versus volume averaging.  Interval development of associated bilateral subdural hygromas versus chronic subdural hematomas.  Critical Value/emergent results were called by telephone at the time of interpretation on 09/22/2014 at 4:41 pm to Dr. Donnetta Hutching , who verbally acknowledged these results.   Electronically Signed   By: Annia Belt Singleton.D.   On: 09/22/2014 16:46   Dg Chest Portable 1 View  09/22/2014   CLINICAL DATA:  Confusion and altered mental status for 1 day; history of COPD, atrial fibrillation, previous CVA, and diabetes  EXAM: PORTABLE CHEST - 1 VIEW  COMPARISON:  Chest x-ray dated September 18, 2014  FINDINGS: The lungs are adequately inflated. There is no focal infiltrate. The cardiac silhouette is top-normal in size. The  pulmonary vascularity is not engorged. There is calcification in the aortic arch and descending thoracic aorta. There is no pleural effusion or pneumothorax. There is moderate to severe degenerative change of the shoulders.  IMPRESSION: There is borderline enlargement of the cardiac silhouette. There is no evidence of pneumonia.   Electronically Signed   By: David  [place]   On: 09/22/2014 16:49    History of Present Illness: VICTORIA HENSHAW is an 80 y.o. female. Pt is 80 yo female of Dr [name]'s who presents from [facility]. She was last seen at dinner last night. When her daughter called this AM and noted the line to be busy, she had staff check on her who noted that she was down and altered. She has recent hx of several  falls in the past month. Also has hx of treating a UTI last week w/ bactrim . Per the daughter at bedside, they want no heroic measures, she is DNR/DNI and she would like to discuss consideration of comfort care w/ pall care team if she does not recover her mental status. In the ED she is noted to be in a fib w/ RVR in the 100-130s, hypertensive (that was corrected w/ nicardipine ggt), hypothermic and w/ a leukocytosis. U/A is borderline remarkable w/ only occ bacteria. Her CT heads shows subarachnoid bleeding, and Dr Dutch Quint w/ NSG was contacted who appropriately deemed this non-operable.   She is resting comfortably after 1mg  IV ativan, b/c she was combative on arrival. She is on [place]. Story was provided by daughter at bedside  Hospital Course: Ms. Dunckel was admitted to a StepDown bed due to Afib with RVR.  She was initially placed on a Cardizem drip with stabilization of her blood pressure and heart rate into the 80s. She was placed on IV fluids due to mild rhabdomyolysis with elevated CK secondary to prolonged fall. She remained unresponsive on the morning following admission. After discussing goals of care and her mother's wishes, her daughter indicated that she desired discontinuation of all life sustaining treatments including IV fluids, antibiotics, and blood pressure medications. She indicated that her mother had stated multiple times that she was ready to die and preferred a natural death. Palliative care medicine was consulted to assist with end-of-life symptom management. She was placed on scheduled morphine with Ativan and atropine as needed. She has remained comfortable with minimal interaction. She is opening her eyes some and speaking with some garbled speech. However, most of the time she is unresponsive. Her daughter is comfortable with end-of-life comfort measures. Arrangements are being made for transfer to [facility] facility for end-of-life care pending bed availability.  Day of  Discharge Exam BP 92/67 mmHg  Pulse 115  Temp(Src) 97.6 F (36.4 C) (Oral)  Resp 19  Ht 5\' 2"  (1.575 Singleton)  Wt 61.7 kg (136 lb 0.4 oz)  BMI 24.87 kg/m2  SpO2 93%  Physical Exam: General appearance: eyes open, nonverbal; does not appear to track  Eyes: no scleral icterus Throat: oropharynx dry  Resp: No labored breathing; lungs are clear Cardio: irregularly irregular rhythm GI: soft, non-tender; bowel sounds normal; no masses,  no organomegaly Extremities: no clubbing, cyanosis or edema  Discharge Labs:  Recent Labs  09/22/14 1530  NA 133*  K 4.1  CL 100  CO2 20  GLUCOSE 116*  BUN 28*  CREATININE 0.91  CALCIUM 9.0    Recent Labs  09/22/14 1530  AST 97*  ALT 41*  ALKPHOS 54  BILITOT 1.0  PROT 6.9  ALBUMIN 3.6    Recent Labs  09/22/14 1530 09/23/14 0750  WBC 12.1* 11.0*  HGB 14.9 13.6  HCT 43.6 40.2  MCV 93.4 96.4  PLT 224 206   Lab Results  Component Value Date   INR 1.0 10/11/2007    Recent Labs  09/23/14 0357  CKTOTAL 715*    Discharge instructions:     Discharge Instructions    Diet general    Complete by:  As directed      Discharge instructions    Complete by:  As directed   End of life comfort measures to be provided by Hospice facility     Increase activity slowly    Complete by:  As directed            Disposition: To [facility] hosp[facility] facility pending bed availability  Follow-up Appts: [facility]  Condition on Discharge: Stable but terminal  Tests Needing Follow-up:  None  CODE STATUS: DO NOT RESUSCITATE  Time with discharge activities: 35 minutes  Signed: Martha ClanShaw, William 09/24/2014, 7:02 AM

## 2014-09-24 NOTE — Progress Notes (Signed)
Nutrition Brief Note  Chart reviewed. Pt now transitioning to comfort care.  No further nutrition interventions warranted at this time.  Please re-consult as needed.   Girl Schissler A. Makai Dumond, RD, LDN Pager: [phone] After hours Pager: [phone]  

## 2014-09-24 NOTE — Progress Notes (Signed)
[facility] , report was given to nurse Marcelline MatesMary Giese.

## 2014-09-24 NOTE — Clinical Social Work Note (Addendum)
UPDATE 12:06 pm Pt has [facility]. Pt's daughter updated regarding discharge disposition.  [facility] Report number: [id number] [id number] Transportation: EMS ([facility])     10:16 am CSW received referral for residential hospice placement at time of discharge. CSW attempted to meet with pt's daughter at bedside to discuss discharge disposition. Pt's daughter currently not present at bedside.   CSW spoke with pt's daughter, via phone, and confirmed pt's [facility] at time of discharge.  CS[facility]. [facility] admissions liaison and [facility] to review pt's clinical information for appropriateness for residential hospice placement.  CSW to continue to follow and assist with discharge planning needs.  Marcelline Deistmily Aidenn Skellenger, LCSWA [phone]) Licensed Clinical Social Worker Orthopedics [phone]([id number]) and Surgical [phone]([id number])

## 2014-10-20 NOTE — ED Notes (Signed)
Spoke to admitting MD, patient is pallative care at this time.

## 2014-10-27 DEATH — deceased

## 2014-11-26 ENCOUNTER — Ambulatory Visit: Payer: Medicare Other | Admitting: Podiatry

## 2014-12-10 ENCOUNTER — Ambulatory Visit: Payer: Medicare Other | Admitting: Podiatry

## 2015-04-22 IMAGING — CT CT HEAD W/O CM
2 series · 16 of 30 positions shown, 18 images · non-contrast
Comparison: Brain CT 10/12/2007

CLINICAL DATA: Altered mental status.

EXAM:
CT HEAD WITHOUT CONTRAST
TECHNIQUE: Contiguous axial images were obtained from the base of the skull
through the vertex without intravenous contrast.

[Series 201: head w/o, idose (1) · axial · non-contrast · 0.46mm/px · z∈[+74,+194]mm · 8 of 32 slices shown, 10 images]
[im 4/32  brain]
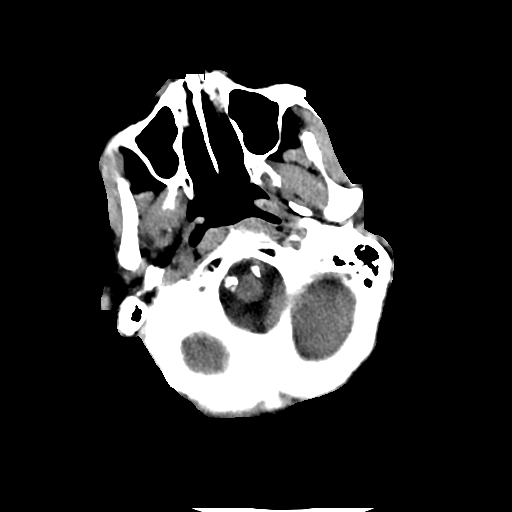
[im 4/32  bone]
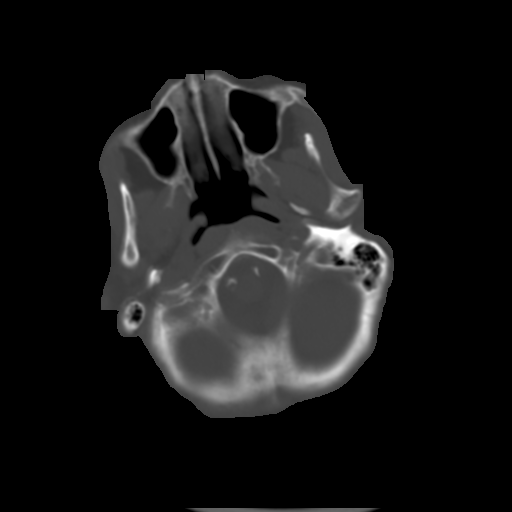
[im 7/32  brain]
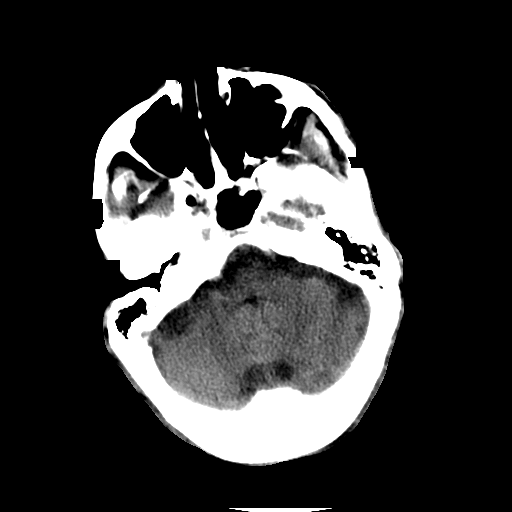
[im 11/32  brain]
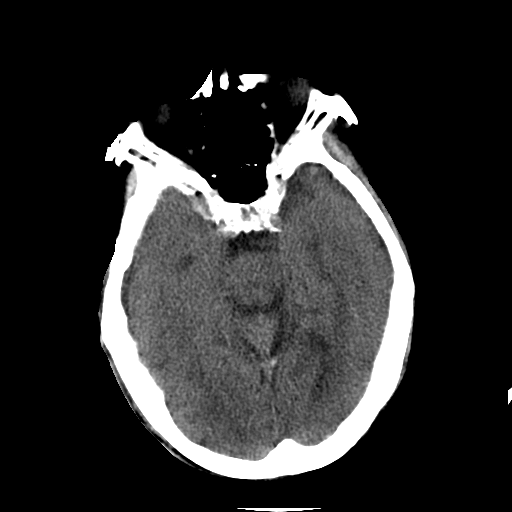
[im 14/32  brain]
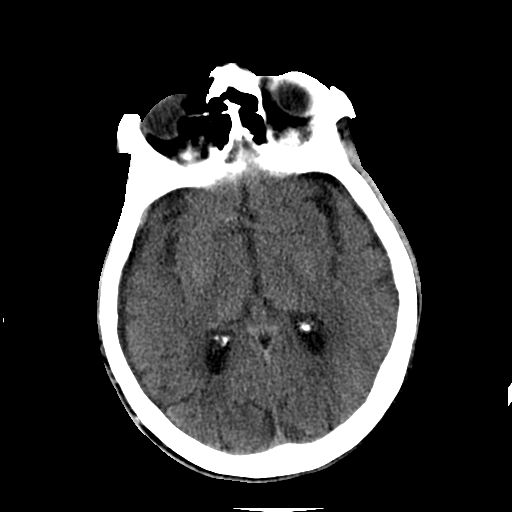
[im 18/32  brain]
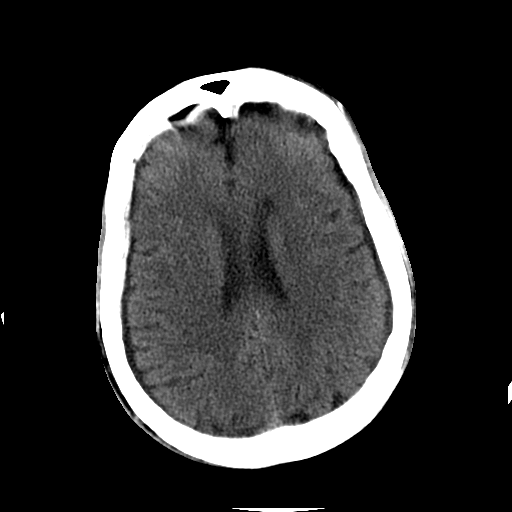
[im 18/32  bone]
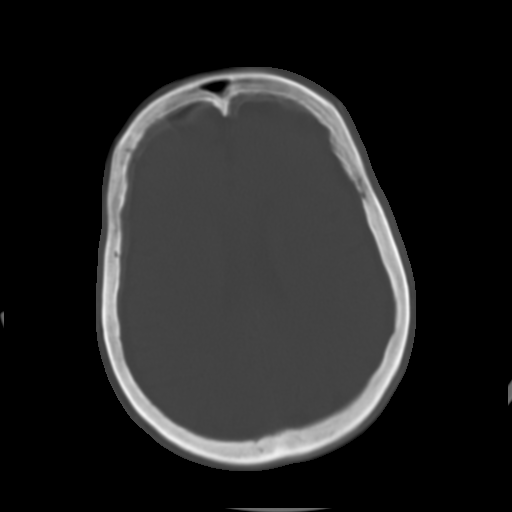
[im 21/32  brain]
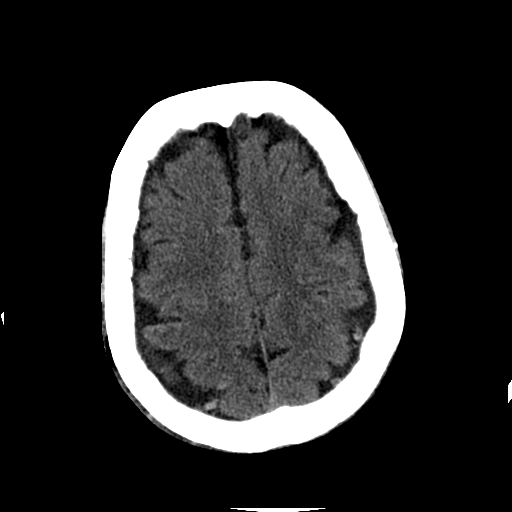
[im 25/32  brain]
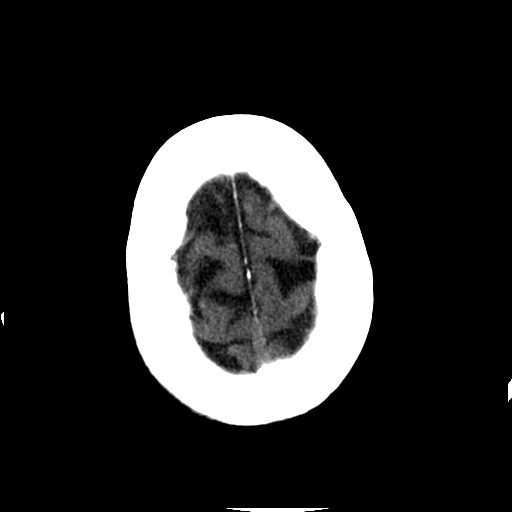
[im 28/32  brain]
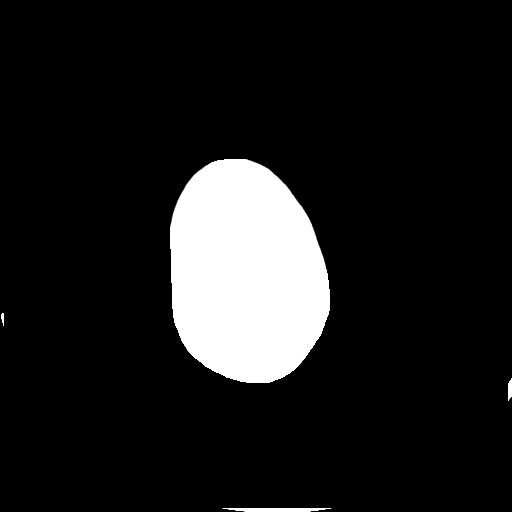

[Series 202: head w/o bone, idose (1) · axial · non-contrast · 0.46mm/px · z∈[+73,+198]mm · 8 of 64 slices shown]
[im 7/64  bone]
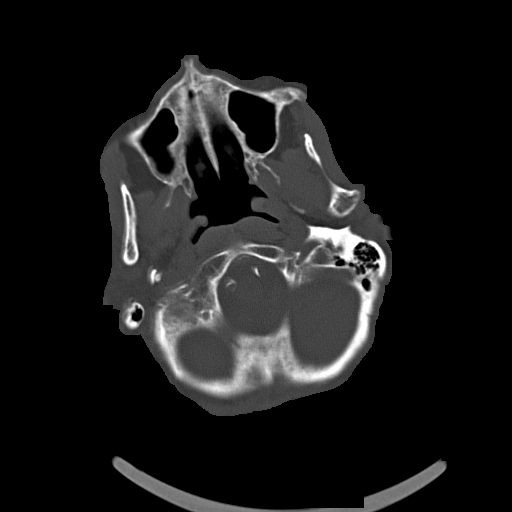
[im 14/64  bone]
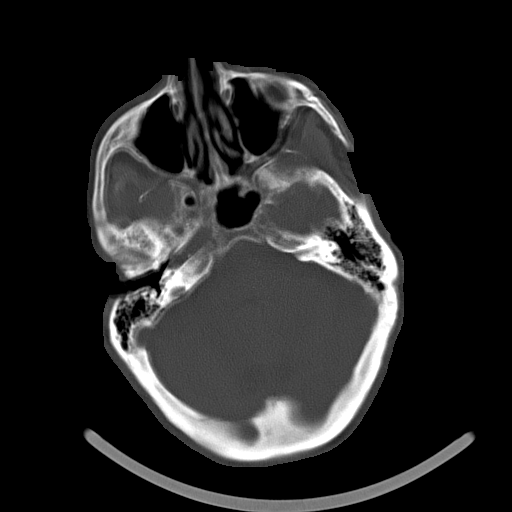
[im 20/64  bone]
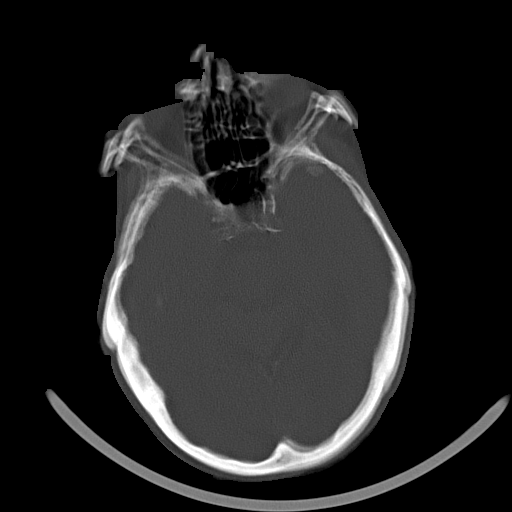
[im 27/64  bone]
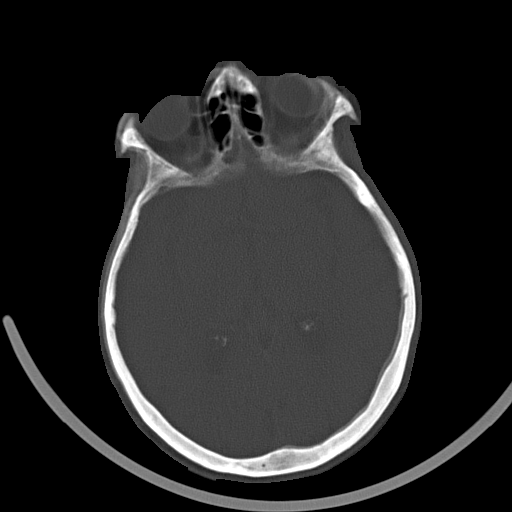
[im 37/64  bone]
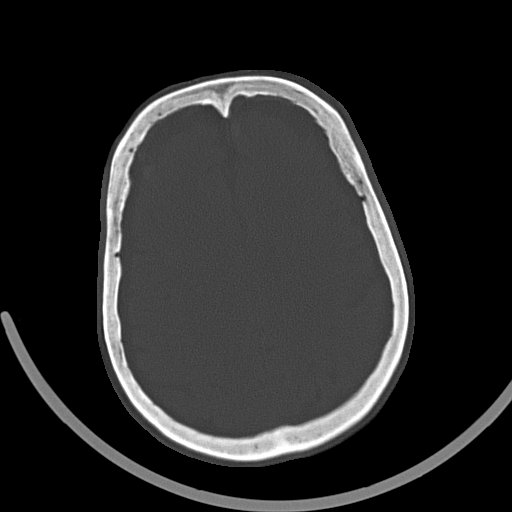
[im 44/64  bone]
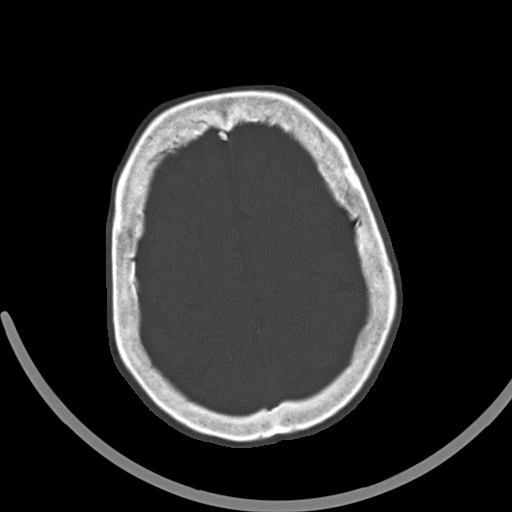
[im 50/64  bone]
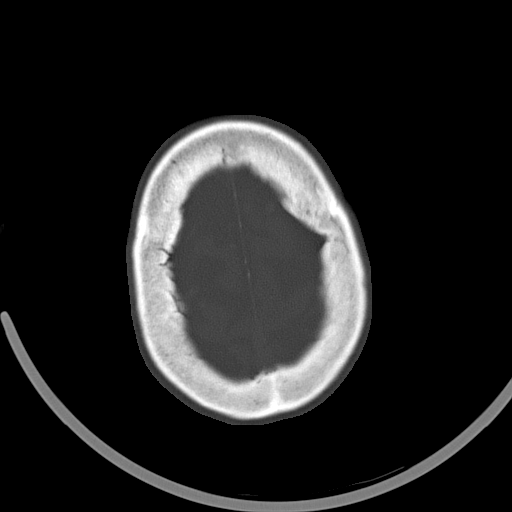
[im 57/64  bone]
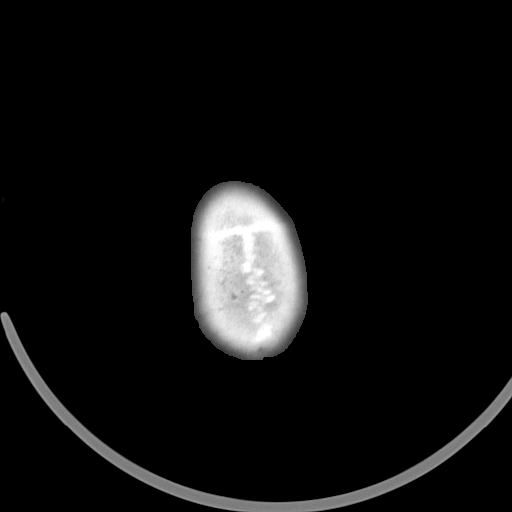

[16 of 30 positions shown; findings below may reference images not displayed]

FINDINGS: Examination markedly limited secondary to motion artifact.

Interval development of bilateral low-attenuation subdural
collections most suggestive of subdural hygromas or hematomas.
Additionally there are high attenuation blood products demonstrated
overlying the right and left frontal lobes (image 19; series 21) and
layering within the bilateral occipital horns of the lateral
ventricles. Possible high attenuation blood products layering along
the left tentorium.

Ventricles and sulci are appropriate for patient age.
Periventricular and subcortical white matter hypodensity compatible
with chronic small vessel ischemic change. Possible focal area of
hypoattenuation within the left temporal lobe (image 10; series
201).

The calvarium, mastoid air cells and paranasal sinuses are poorly
evaluated due to significant motion artifact.
IMPRESSION: Markedly limited evaluation secondary to motion artifact.

Possible focal area of low-attenuation along the posterior aspect of
the left temporal lobe may represent artifact and volume averaging
in the setting of significant motion artifact. Ischemic change is
not entirely excluded.

High attenuation blood products are demonstrated overlying the right
and left frontal lobes, likely representing acute subarachnoid blood
products. Additionally blood products are demonstrated layering
within the occipital horns of the lateral ventricles.

There is suggestion of high attenuation blood products layering
along the left tentorium versus volume averaging.

Interval development of associated bilateral subdural hygromas
versus chronic subdural hematomas.

Critical Value/emergent results were called by telephone at the time
of interpretation on 09/22/2014 at [DATE] to Dr. KULWINDER ENGELS , who
verbally acknowledged these results.
